# Patient Record
Sex: Male | Born: 1970 | Race: Black or African American | Hispanic: No | Marital: Single | Smoking: Current every day smoker
Health system: Southern US, Community
[De-identification: ages and names within clinical notes are randomized; demographics above are authoritative.]

## PROBLEM LIST (undated history)

## (undated) HISTORY — PX: FRACTURE SURGERY: SHX138

## (undated) HISTORY — PX: CARDIAC CATHETERIZATION: SHX172

---

## 1995-12-02 HISTORY — PX: ELBOW FRACTURE SURGERY: SHX616

## 2017-04-16 ENCOUNTER — Encounter (HOSPITAL_COMMUNITY)
Admission: EM | Disposition: A | Discharge status: Home / Self Care | Payer: Self-pay | Source: Home / Self Care | Attending: Emergency Medicine

## 2017-04-16 ENCOUNTER — Observation Stay (HOSPITAL_COMMUNITY): Payer: Self-pay

## 2017-04-16 ENCOUNTER — Observation Stay (HOSPITAL_COMMUNITY)
Admission: EM | Admit: 2017-04-16 | Discharge: 2017-04-17 | Disposition: A | Discharge status: Home / Self Care | Payer: Self-pay | Attending: Internal Medicine | Admitting: Internal Medicine

## 2017-04-16 ENCOUNTER — Encounter (HOSPITAL_COMMUNITY): Payer: Self-pay | Admitting: Emergency Medicine

## 2017-04-16 ENCOUNTER — Emergency Department (HOSPITAL_COMMUNITY): Payer: Self-pay

## 2017-04-16 DIAGNOSIS — F17200 Nicotine dependence, unspecified, uncomplicated: Secondary | ICD-10-CM | POA: Diagnosis present

## 2017-04-16 DIAGNOSIS — F17210 Nicotine dependence, cigarettes, uncomplicated: Secondary | ICD-10-CM | POA: Insufficient documentation

## 2017-04-16 DIAGNOSIS — Z0389 Encounter for observation for other suspected diseases and conditions ruled out: Secondary | ICD-10-CM

## 2017-04-16 DIAGNOSIS — F1410 Cocaine abuse, uncomplicated: Secondary | ICD-10-CM | POA: Insufficient documentation

## 2017-04-16 DIAGNOSIS — IMO0001 Reserved for inherently not codable concepts without codable children: Secondary | ICD-10-CM

## 2017-04-16 DIAGNOSIS — R0789 Other chest pain: Principal | ICD-10-CM | POA: Insufficient documentation

## 2017-04-16 DIAGNOSIS — I213 ST elevation (STEMI) myocardial infarction of unspecified site: Secondary | ICD-10-CM

## 2017-04-16 DIAGNOSIS — R7989 Other specified abnormal findings of blood chemistry: Secondary | ICD-10-CM | POA: Diagnosis present

## 2017-04-16 DIAGNOSIS — I200 Unstable angina: Secondary | ICD-10-CM

## 2017-04-16 DIAGNOSIS — R9431 Abnormal electrocardiogram [ECG] [EKG]: Secondary | ICD-10-CM

## 2017-04-16 DIAGNOSIS — R079 Chest pain, unspecified: Secondary | ICD-10-CM | POA: Diagnosis present

## 2017-04-16 HISTORY — PX: LEFT HEART CATH AND CORONARY ANGIOGRAPHY: CATH118249

## 2017-04-16 LAB — I-STAT CHEM 8, ED
BUN: 9 mg/dL (ref 6–20)
CALCIUM ION: 1.12 mmol/L — AB (ref 1.15–1.40)
CHLORIDE: 105 mmol/L (ref 101–111)
CREATININE: 0.7 mg/dL (ref 0.61–1.24)
GLUCOSE: 75 mg/dL (ref 65–99)
HCT: 42 % (ref 39.0–52.0)
HEMOGLOBIN: 14.3 g/dL (ref 13.0–17.0)
Potassium: 4.1 mmol/L (ref 3.5–5.1)
Sodium: 140 mmol/L (ref 135–145)
TCO2: 24 mmol/L (ref 0–100)

## 2017-04-16 LAB — CBC
HCT: 38.2 % — ABNORMAL LOW (ref 39.0–52.0)
Hemoglobin: 12.9 g/dL — ABNORMAL LOW (ref 13.0–17.0)
MCH: 27.3 pg (ref 26.0–34.0)
MCHC: 33.8 g/dL (ref 30.0–36.0)
MCV: 80.8 fL (ref 78.0–100.0)
PLATELETS: 260 10*3/uL (ref 150–400)
RBC: 4.73 MIL/uL (ref 4.22–5.81)
RDW: 15.2 % (ref 11.5–15.5)
WBC: 8.3 10*3/uL (ref 4.0–10.5)

## 2017-04-16 LAB — BASIC METABOLIC PANEL
Anion gap: 9 (ref 5–15)
BUN: 11 mg/dL (ref 6–20)
CALCIUM: 9 mg/dL (ref 8.9–10.3)
CO2: 23 mmol/L (ref 22–32)
CREATININE: 0.84 mg/dL (ref 0.61–1.24)
Chloride: 107 mmol/L (ref 101–111)
GFR calc non Af Amer: >60 mL/min (ref >60)
Glucose, Bld: 77 mg/dL (ref 65–99)
Potassium: 4.1 mmol/L (ref 3.5–5.1)
SODIUM: 139 mmol/L (ref 135–145)

## 2017-04-16 LAB — LIPID PANEL
Cholesterol: 153 mg/dL (ref 0–200)
HDL: 51 mg/dL (ref >40)
LDL CALC: 95 mg/dL (ref 0–99)
TRIGLYCERIDES: 36 mg/dL (ref <150)
Total CHOL/HDL Ratio: 3 RATIO
VLDL: 7 mg/dL (ref 0–40)

## 2017-04-16 LAB — RAPID URINE DRUG SCREEN, HOSP PERFORMED
AMPHETAMINES: NOT DETECTED
BENZODIAZEPINES: POSITIVE — AB
Barbiturates: NOT DETECTED
Cocaine: POSITIVE — AB
OPIATES: NOT DETECTED
TETRAHYDROCANNABINOL: NOT DETECTED

## 2017-04-16 LAB — TROPONIN I
Troponin I: <0.03 ng/mL (ref <0.03)
Troponin I: <0.03 ng/mL (ref <0.03)

## 2017-04-16 LAB — PROTIME-INR
INR: 0.9
PROTHROMBIN TIME: 12.2 s (ref 11.4–15.2)

## 2017-04-16 LAB — TSH: TSH: 0.331 u[IU]/mL — AB (ref 0.350–4.500)

## 2017-04-16 LAB — I-STAT TROPONIN, ED: TROPONIN I, POC: 0 ng/mL (ref 0.00–0.08)

## 2017-04-16 SURGERY — LEFT HEART CATH AND CORONARY ANGIOGRAPHY
Anesthesia: LOCAL

## 2017-04-16 MED ORDER — LIDOCAINE HCL (PF) 1 % IJ SOLN
INTRAMUSCULAR | Status: AC
  Filled 2017-04-16: qty 30

## 2017-04-16 MED ORDER — SODIUM CHLORIDE 0.9 % IV SOLN: INTRAVENOUS | Status: AC

## 2017-04-16 MED ORDER — MIDAZOLAM HCL 2 MG/2ML IJ SOLN
INTRAMUSCULAR | Status: DC | PRN
  Administered 2017-04-16: 1 mg via INTRAVENOUS

## 2017-04-16 MED ORDER — FENTANYL CITRATE (PF) 100 MCG/2ML IJ SOLN
INTRAMUSCULAR | Status: DC | PRN
  Administered 2017-04-16: 25 ug via INTRAVENOUS

## 2017-04-16 MED ORDER — VERAPAMIL HCL 2.5 MG/ML IV SOLN
INTRAVENOUS | Status: AC
  Filled 2017-04-16: qty 2

## 2017-04-16 MED ORDER — ASPIRIN 325 MG PO TABS
325.0000 mg | ORAL_TABLET | Freq: Once | ORAL | Status: AC
  Administered 2017-04-16: 325 mg via ORAL
  Filled 2017-04-16: qty 1

## 2017-04-16 MED ORDER — IOPAMIDOL (ISOVUE-370) INJECTION 76%
INTRAVENOUS | Status: DC | PRN
  Administered 2017-04-16: 80 mL via INTRAVENOUS

## 2017-04-16 MED ORDER — SODIUM CHLORIDE 0.9% FLUSH
3.0000 mL | Freq: Two times a day (BID) | INTRAVENOUS | Status: DC
  Administered 2017-04-16 – 2017-04-17 (×2): 3 mL via INTRAVENOUS

## 2017-04-16 MED ORDER — NITROGLYCERIN 0.4 MG SL SUBL
0.4000 mg | SUBLINGUAL_TABLET | SUBLINGUAL | Status: DC | PRN
  Administered 2017-04-16: 0.4 mg via SUBLINGUAL
  Filled 2017-04-16: qty 1

## 2017-04-16 MED ORDER — HEPARIN (PORCINE) IN NACL 2-0.9 UNIT/ML-% IJ SOLN
INTRAMUSCULAR | Status: AC
  Filled 2017-04-16: qty 1000

## 2017-04-16 MED ORDER — ENOXAPARIN SODIUM 40 MG/0.4ML ~~LOC~~ SOLN: 40.0000 mg | SUBCUTANEOUS | Status: DC

## 2017-04-16 MED ORDER — FENTANYL CITRATE (PF) 100 MCG/2ML IJ SOLN
INTRAMUSCULAR | Status: AC
  Filled 2017-04-16: qty 2

## 2017-04-16 MED ORDER — SODIUM CHLORIDE 0.9 % IV SOLN: 250.0000 mL | INTRAVENOUS | Status: DC | PRN

## 2017-04-16 MED ORDER — HEPARIN (PORCINE) IN NACL 2-0.9 UNIT/ML-% IJ SOLN
INTRAMUSCULAR | Status: AC | PRN
  Administered 2017-04-16: 1000 mL

## 2017-04-16 MED ORDER — IOPAMIDOL (ISOVUE-370) INJECTION 76%
INTRAVENOUS | Status: AC
  Filled 2017-04-16: qty 100

## 2017-04-16 MED ORDER — LIDOCAINE HCL (PF) 1 % IJ SOLN
INTRAMUSCULAR | Status: DC | PRN
  Administered 2017-04-16: 2 mL

## 2017-04-16 MED ORDER — ONDANSETRON HCL 4 MG/2ML IJ SOLN: 4.0000 mg | Freq: Four times a day (QID) | INTRAMUSCULAR | Status: DC | PRN

## 2017-04-16 MED ORDER — ACETAMINOPHEN 325 MG PO TABS: 650.0000 mg | ORAL_TABLET | ORAL | Status: DC | PRN

## 2017-04-16 MED ORDER — HEPARIN BOLUS VIA INFUSION
5000.0000 [IU] | Freq: Once | INTRAVENOUS | Status: AC
  Administered 2017-04-16: 5000 [IU] via INTRAVENOUS

## 2017-04-16 MED ORDER — ASPIRIN 81 MG PO CHEW
81.0000 mg | CHEWABLE_TABLET | Freq: Every day | ORAL | Status: DC
  Administered 2017-04-17: 81 mg via ORAL
  Filled 2017-04-16: qty 1

## 2017-04-16 MED ORDER — SODIUM CHLORIDE 0.9% FLUSH: 3.0000 mL | INTRAVENOUS | Status: DC | PRN

## 2017-04-16 MED ORDER — HEPARIN SODIUM (PORCINE) 1000 UNIT/ML IJ SOLN
INTRAMUSCULAR | Status: DC | PRN
  Administered 2017-04-16: 3000 [IU] via INTRAVENOUS

## 2017-04-16 MED ORDER — MIDAZOLAM HCL 2 MG/2ML IJ SOLN
INTRAMUSCULAR | Status: AC
  Filled 2017-04-16: qty 2

## 2017-04-16 MED ORDER — NITROGLYCERIN 1 MG/10 ML FOR IR/CATH LAB
INTRA_ARTERIAL | Status: AC
  Filled 2017-04-16: qty 10

## 2017-04-16 MED ORDER — VERAPAMIL HCL 2.5 MG/ML IV SOLN
INTRAVENOUS | Status: DC | PRN
  Administered 2017-04-16: 10 mL via INTRA_ARTERIAL

## 2017-04-16 SURGICAL SUPPLY — 12 items

## 2017-04-16 NOTE — ED Notes (Signed)
[facility] has arrived as pt was receiving initial treatments.  Report given to [employer].  Pt has been given a total of 1 SL nitroglycerin, Heparin 5000mg  IV, and ASA 325mg  PO

## 2017-04-16 NOTE — ED Triage Notes (Signed)
Pt arrives cardiology to bedside pt received total 3 Nitro 325mg  ASA and 5000 units of heparin

## 2017-04-16 NOTE — ED Triage Notes (Addendum)
Pt at work today and lifting heavy equipment, pt began experiencing sharp pain in L chest, pt rested and pain improved from 10/10 to 5/10. Pt stated pain worsened again with activity. States he did not take any OTC meds, did not take any ASA. Denies SOB, denies dizziness.

## 2017-04-16 NOTE — ED Notes (Signed)
Pt arrives sitting up answering questions appears in no distress Cardiologist at bedside

## 2017-04-16 NOTE — ED Provider Notes (Signed)
[facility] DEPT Provider Note   CSN: [id number] Arrival date & time: 04/16/17  5366     History   Chief Complaint No chief complaint on file.   HPI Derrick Jenkins is a 46 y.o. male with no known previous medical problems but has not seen doctors for years who presented with chest pain. States that he works at a [employer] and was at work at 6 am and usually [employer]. Today, around 7 am, had sudden onset of substernal chest pain that is 10/10 with no radiation. Denies jaw pain or arm pain. Denies abdominal pain or leg pain. No hx of DM, HTN, or CAD but hasn't seen doctors in years. Just moved here from [place] and has no doctors here. Denies any leg swelling or hx of blood clots. Still has 5/10 pain.   The history is provided by the patient.    History reviewed. No pertinent past medical history.  There are no active problems to display for this patient.   History reviewed. No pertinent surgical history.     Home Medications    Prior to Admission medications   Not on File    Family History No family history on file.  Social History Social History  Substance Use Topics  . Smoking status: Current Every Day Smoker    Types: Cigarettes  . Smokeless tobacco: Not on file  . Alcohol use Not on file     Allergies   Patient has no allergy information on record.   Review of Systems Review of Systems  Cardiovascular: Positive for chest pain.  All other systems reviewed and are negative.    Physical Exam Updated Vital Signs BP 125/79 (BP Location: Left Arm)   Pulse 77   Temp 98.8 F (37.1 C) (Oral)   Resp 19   SpO2 97%   Physical Exam  Constitutional: He is oriented to person, place, and time.  Uncomfortable   HENT:  Head: Normocephalic.  Eyes: EOM are normal. Pupils are equal, round, and reactive to light.  Neck: Normal range of motion. Neck supple.  Cardiovascular: Normal rate, regular rhythm and normal heart sounds.   Pulmonary/Chest: Effort  normal and breath sounds normal. No respiratory distress. He has no wheezes.  Abdominal: Soft. Bowel sounds are normal. He exhibits no distension. There is no tenderness.  Musculoskeletal: Normal range of motion. He exhibits no edema.  No pedal edema   Neurological: He is alert and oriented to person, place, and time. No cranial nerve deficit. Coordination normal.  Skin: Skin is warm.  Psychiatric: He has a normal mood and affect.  Nursing note and vitals reviewed.    ED Treatments / Results  Labs (all labs ordered are listed, but only abnormal results are displayed) Labs Reviewed  I-STAT CHEM 8, ED - Abnormal; Notable for the following:       Result Value   Calcium, Ion 1.12 (*)    All other components within normal limits  BASIC METABOLIC PANEL  CBC  PROTIME-INR  I-STAT TROPOININ, ED    EKG  EKG Interpretation  Date/Time:  Thursday Apr 16 2017 08:46:52 EDT Ventricular Rate:  64 PR Interval:    QRS Duration: 79 QT Interval:  379 QTC Calculation: 391 R Axis:   70 Text Interpretation:  Sinus rhythm Left ventricular hypertrophy ST elevation suggests acute pericarditis possible lateral STEMI  Confirmed by Adair Lemar  MD, Oliver Heitzenrater ([id number]) on 04/16/2017 8:51:55 AM       Radiology No results found.  Procedures Procedures (  including critical care time)  CRITICAL CARE Performed by: Wandra Arthurs   Total critical care time:30  minutes  Critical care time was exclusive of separately billable procedures and treating other patients.  Critical care was necessary to treat or prevent imminent or life-threatening deterioration.  Critical care was time spent personally by me on the following activities: development of treatment plan with patient and/or surrogate as well as nursing, discussions with consultants, evaluation of patient's response to treatment, examination of patient, obtaining history from patient or surrogate, ordering and performing treatments and interventions, ordering and  review of laboratory studies, ordering and review of radiographic studies, pulse oximetry and re-evaluation of patient's condition.   Medications Ordered in ED Medications  nitroGLYCERIN (NITROSTAT) SL tablet 0.4 mg (0.4 mg Sublingual Given 04/16/17 0850)  aspirin tablet 325 mg (325 mg Oral Given 04/16/17 0850)     Initial Impression / Assessment and Plan / ED Course  I have reviewed the triage vital signs and the nursing notes.  Pertinent labs & imaging results that were available during my care of the patient were reviewed by me and considered in my medical decision making (see chart for details).     Derrick Jenkins is a 46 y.o. male here with chest pain. Substernal chest pain still 5/10. Never had stress test and doesn't see a doctor. Initial EKG showed possible J point vs inferior and lateral STEMI. Given that he has good story for ACS, I am concerned for STEMI instead of J point elevation. He is given ASA, heparin, nitro. Repeat EKG showed persistent inferior and lateral STEMI. Code STEMI activated at 8:50 am. I called Dr. Martinique in the cath lab. Will transfer emergently to [facility] for STEMI. Dr. Jeneen Rinks in the ED aware of patient.   Final Clinical Impressions(s) / ED Diagnoses   Final diagnoses:  ST elevation myocardial infarction (STEMI), unspecified artery [facility])    New Prescriptions New Prescriptions   No medications on file     Drenda Freeze, MD 04/16/17 [phone]

## 2017-04-16 NOTE — ED Provider Notes (Signed)
Pt seen on arrival.  Cartilage is present. Repeat EKG obtained. Patient had been given nitroglycerin in route. Pain is improved but not completely resolved. Has had aspirin. Repeat EKG continues to show J-point elevation versus ST elevation and marked voltage laterally without reciprocal changes. Plan is directly to the Cath Lab as patient has ongoing pain. Hemolytically stable and well oxygenated in the emergency room.   Tanna Furry, MD 04/16/17 [phone]

## 2017-04-16 NOTE — ED Notes (Signed)
Code sepsis called by Dr Darl Householder verbally and was not put in computer.But was called within time limit.

## 2017-04-16 NOTE — ED Notes (Signed)
Cardiologist at bedside consent received for cardiac catheterization pt remains awake and alert and in no distress

## 2017-04-16 NOTE — H&P (Signed)
Cardiology History & Physical    Patient ID: Derrick Jenkins MRN: [id number], DOB: [birth year] Date of Encounter: 04/16/2017, 9:42 AM Primary Physician: Patient, No Pcp Per Primary Cardiologist: None  Chief Complaint: Chest pain Reason for Admission: STEMI Requesting MD: Derrick Goltz, MD  HPI: [name] is a [age]-year-old male with no significant past medical history, who developed acute onset of chest pain this morning. He was at work lifting heavy objects when he had sudden sharp substernal chest pain. Pain was worsened by movement, the maximal intensity of 10/10. He denies accompanying symptoms like shortness of breath, diaphoresis, and nausea. The pain did not radiate to the back. He has never experiences before. He presented to the [facility] emergency department, where her EKG was concerning for lateral ST segment elevation in the setting of LVH. He received sublingual nitroglycerin 3 with improvement but incomplete resolution of his chest pain. He continues to have mild chest pressure.  Of note, Derrick Jenkins used cocaine for the first time 3 days ago. He smokes half a pack a day and also drinks a 12 pack of beer on weekends.  History reviewed. No pertinent past medical history.   Surgical History: History reviewed. No pertinent surgical history.   Home Meds: Prior to Admission medications   Not on File  Patient does not take any medications currently.  Allergies: No Known Allergies  Social History   Social History  . Marital status: Single    Spouse name: N/A  . Number of children: N/A  . Years of education: N/A   Occupational History  . Not on file.   Social History Main Topics  . Smoking status: Current Every Day Smoker    Packs/day: 0.50    Types: Cigarettes  . Smokeless tobacco: Never Used  . Alcohol use 7.2 oz/week    12 Cans of beer per week  . Drug use: Yes    Types: Cocaine     Comment: Used for the first time 3 days ago  . Sexual activity: Yes   Partners: Male   Other Topics Concern  . Not on file   Social History Narrative  . No narrative on file     Family History  Problem Relation Age of Onset  . Lupus Mother   . Kidney failure Mother     Review of Systems: A 12-system review of systems was performed and is negative except as noted in the HPI.  Labs:   Lab Results  Component Value Date   WBC 8.3 04/16/2017   HGB 14.3 04/16/2017   HCT 42.0 04/16/2017   MCV 80.8 04/16/2017   PLT 260 04/16/2017     Recent Labs Lab 04/16/17 0847 04/16/17 0855  NA 139 140  K 4.1 4.1  CL 107 105  CO2 23  --   BUN 11 9  CREATININE 0.84 0.70  CALCIUM 9.0  --   GLUCOSE 77 75   No results for input(s): CKTOTAL, CKMB, TROPONINI in the last 72 hours. No results found for: CHOL, HDL, LDLCALC, TRIG No results found for: DDIMER  Radiology/Studies:  No results found. Wt Readings from Last 3 Encounters:  No data found for Wt    EKG: Sinus rhythm with LVH and ST elevation most pronounced in the lateral precordial leads.  Physical Exam: Blood pressure 125/71, pulse 65, temperature 97.7 F (36.5 C), temperature source Oral, resp. rate 18, SpO2 100 %. There is no height or weight on file to calculate BMI. General: Well developed,  well nourished, in no acute distress. Head: Normocephalic, atraumatic, sclera non-icteric, no xanthomas, nares are without discharge.  Neck: Negative for carotid bruits. JVD not elevated. Lungs: Clear bilaterally to auscultation without wheezes, rales, or rhonchi. Breathing is unlabored. Heart: RRR with S1 S2. No murmurs, rubs, or gallops appreciated. Abdomen: Soft, non-tender, non-distended with normoactive bowel sounds. No hepatomegaly. No rebound/guarding. No obvious abdominal masses. Msk:  Strength and tone appear normal for age. Extremities: No clubbing or cyanosis. No edema.  Distal pedal pulses are 2+ and equal bilaterally. Neuro: Alert and oriented X 3. No focal deficit. No facial asymmetry.  Moves all extremities spontaneously. Psych:  Responds to questions appropriately with a normal affect.    Assessment and Plan  46 year old man with no significant past medical history presenting with acute onset of chest pain or lateral ST segment elevation. Though his EKG is suspicious for possible LVH with abnormal repolarization, in the setting of ongoing chest pain, I feel it is important to exclude acute myocardial infarction. We have therefore agreed to proceed with emergent left heart catheterization and possible PCI. I have reviewed the risks, indications, and alternatives to cardiac catheterization, possible angioplasty, and stenting with the patient. Risks include but are not limited to bleeding, infection, vascular injury, stroke, myocardial infection, arrhythmia, kidney injury, radiation-related injury in the case of prolonged fluoroscopy use, emergency cardiac surgery, and death. The patient understands the risks of serious complication is 1-2 in 9532 with diagnostic cardiac cath and 1-2% or less with angioplasty/stenting. Further recommendations to be made based on results of catheterization.  Derrick Friends Daneille Desilva MD 04/16/2017, 9:42 AM Pager: [phone]

## 2017-04-17 ENCOUNTER — Observation Stay (HOSPITAL_BASED_OUTPATIENT_CLINIC_OR_DEPARTMENT_OTHER): Payer: Self-pay

## 2017-04-17 DIAGNOSIS — R7989 Other specified abnormal findings of blood chemistry: Secondary | ICD-10-CM | POA: Diagnosis present

## 2017-04-17 DIAGNOSIS — IMO0001 Reserved for inherently not codable concepts without codable children: Secondary | ICD-10-CM

## 2017-04-17 DIAGNOSIS — R079 Chest pain, unspecified: Secondary | ICD-10-CM

## 2017-04-17 DIAGNOSIS — R072 Precordial pain: Secondary | ICD-10-CM

## 2017-04-17 DIAGNOSIS — F1410 Cocaine abuse, uncomplicated: Secondary | ICD-10-CM

## 2017-04-17 DIAGNOSIS — F17200 Nicotine dependence, unspecified, uncomplicated: Secondary | ICD-10-CM

## 2017-04-17 DIAGNOSIS — Z0389 Encounter for observation for other suspected diseases and conditions ruled out: Secondary | ICD-10-CM

## 2017-04-17 LAB — BASIC METABOLIC PANEL
Anion gap: 8 (ref 5–15)
BUN: 12 mg/dL (ref 6–20)
CO2: 24 mmol/L (ref 22–32)
CREATININE: 0.85 mg/dL (ref 0.61–1.24)
Calcium: 8.8 mg/dL — ABNORMAL LOW (ref 8.9–10.3)
Chloride: 105 mmol/L (ref 101–111)
Glucose, Bld: 104 mg/dL — ABNORMAL HIGH (ref 65–99)
POTASSIUM: 3.9 mmol/L (ref 3.5–5.1)
SODIUM: 137 mmol/L (ref 135–145)

## 2017-04-17 LAB — CBC
HCT: 37.5 % — ABNORMAL LOW (ref 39.0–52.0)
HEMOGLOBIN: 12.4 g/dL — AB (ref 13.0–17.0)
MCH: 27 pg (ref 26.0–34.0)
MCHC: 33.1 g/dL (ref 30.0–36.0)
MCV: 81.5 fL (ref 78.0–100.0)
PLATELETS: 235 10*3/uL (ref 150–400)
RBC: 4.6 MIL/uL (ref 4.22–5.81)
RDW: 15.6 % — ABNORMAL HIGH (ref 11.5–15.5)
WBC: 7.3 10*3/uL (ref 4.0–10.5)

## 2017-04-17 LAB — HEMOGLOBIN A1C
HEMOGLOBIN A1C: 5.8 % — AB (ref 4.8–5.6)
MEAN PLASMA GLUCOSE: 120 mg/dL

## 2017-04-17 LAB — ECHOCARDIOGRAM COMPLETE

## 2017-04-17 MED ORDER — ACETAMINOPHEN 325 MG PO TABS: 650.0000 mg | ORAL_TABLET | ORAL | Status: DC | PRN

## 2017-04-17 MED ORDER — ASPIRIN 81 MG PO CHEW: 81.0000 mg | CHEWABLE_TABLET | Freq: Every day | ORAL | Status: DC

## 2017-04-17 MED ORDER — NITROGLYCERIN 0.4 MG SL SUBL: 0.4000 mg | SUBLINGUAL_TABLET | SUBLINGUAL | 2 refills | Status: DC | PRN

## 2017-04-17 NOTE — Progress Notes (Signed)
Progress Note  Patient Name: Derrick Jenkins Date of Encounter: 04/17/2017  Primary Cardiologist: Dr End  Subjective   No chest pain  Inpatient Medications    Scheduled Meds: . aspirin  81 mg Oral Daily  . enoxaparin (LOVENOX) injection  40 mg Subcutaneous Q24H  . sodium chloride flush  3 mL Intravenous Q12H   Continuous Infusions: . sodium chloride     PRN Meds: sodium chloride, acetaminophen, nitroGLYCERIN, ondansetron (ZOFRAN) IV, sodium chloride flush   Vital Signs    Vitals:   04/16/17 1315 04/16/17 1345 04/16/17 2114 04/17/17 0528  BP: (!) 94/50 (!) 105/50 122/63 127/78  Pulse: (!) 59 (!) 127 62 (!) 59  Resp:   18 18  Temp:   97.7 F (36.5 C) 98 F (36.7 C)  TempSrc:   Oral Oral  SpO2:   100% 99%    Intake/Output Summary (Last 24 hours) at 04/17/17 0902 Last data filed at 04/16/17 2116  Gross per 24 hour  Intake              480 ml  Output                0 ml  Net              480 ml   There were no vitals filed for this visit.  Telemetry    NSR - Personally Reviewed  ECG    NSR, SB LVH, NSST changes - Personally Reviewed  Physical Exam   GEN: No acute distress.   Neck: No JVD Cardiac: RRR, no murmurs, rubs, or gallops.  Respiratory: Clear to auscultation bilaterally. GI: Soft, nontender, non-distended  MS: Rt radial without hemaotoma Neuro:  Nonfocal  Psych: Normal affect   Labs    Chemistry  Recent Labs Lab 04/16/17 0847 04/16/17 0855 04/17/17 0336  NA 139 140 137  K 4.1 4.1 3.9  CL 107 105 105  CO2 23  --  24  GLUCOSE 77 75 104*  BUN 11 9 12   CREATININE 0.84 0.70 0.85  CALCIUM 9.0  --  8.8*  GFRNONAA >60  --  >60  GFRAA >60  --  >60  ANIONGAP 9  --  8     Hematology  Recent Labs Lab 04/16/17 0847 04/16/17 0855 04/17/17 0336  WBC 8.3  --  7.3  RBC 4.73  --  4.60  HGB 12.9* 14.3 12.4*  HCT 38.2* 42.0 37.5*  MCV 80.8  --  81.5  MCH 27.3  --  27.0  MCHC 33.8  --  33.1  RDW 15.2  --  15.6*  PLT 260  --  235      Cardiac Enzymes  Recent Labs Lab 04/16/17 1110 04/16/17 1548 04/16/17 2208  TROPONINI <0.03 <0.03 <0.03     Recent Labs Lab 04/16/17 0853  TROPIPOC 0.00     BNPNo results for input(s): BNP, PROBNP in the last 168 hours.   DDimer No results for input(s): DDIMER in the last 168 hours.   Radiology    Dg Chest Port 1 View  Result Date: 04/16/2017 CLINICAL DATA:  Status post cardiac catheterization. EXAM: PORTABLE CHEST 1 VIEW COMPARISON:  None. FINDINGS: The heart size and mediastinal contours are within normal limits. Both lungs are clear. The visualized skeletal structures are unremarkable. IMPRESSION: No active disease. Electronically Signed   By: Kerby Moors M.D.   On: 04/16/2017 15:51    Cardiac Studies   Cath 04/16/17- 1. Mild plaquing of the distal  LAD. Otherwise, no angiographically significant coronary artery disease. 2. Normal left ventricular filling pressure and contraction.   Patient Profile     46 y.o. male, just moved to the area, no PCP, admitted through the [facility] ED 04/16/17 with chest worrisome for [place] and with an abnormal EKG. He admitted to recent cocaine use and is a smoker. He was transferred to [facility] for cath. This revealed no obstructive CAD. His Troponin have been negative. Echo is pending.  Assessment & Plan   Chest pain with high risk of acute coronary syndrome Non obstructive CAD at cath, negative Troponin  Abnormal EKG  LVH and NSST changes  Cocaine abuse Drug screen +  Smoker 1/2 pk day  Low TSH level 0.331- no symptoms c/w hyperthyroidism  Plan: Ambulate, echo today, he can probably go home after his echo and f/u results and repeat TSH as an OP. Discussed smoking cessation and cocaine use.   Signed, Kerin Ransom, PA-C  04/17/2017, 9:02 AM    I have seen, examined and evaluated the patient this AM along with Mr. Rosalyn Gess, [place] on rounds.  After reviewing all the available data and chart, we discussed the patients laboratory, study &  physical findings as well as symptoms in detail. I agree with his findings, examination as well as impression recommendations as per our discussion.    Attending adjustments noted in italics.   He is doing well today. I suspect his EKG changes related to repolarization. He has no tachycardia ordered which I don't necessarily think needs to be done prior to discharge. If it is not done by mid afternoon because of scheduling issues, this can just be done as an outpatient. Otherwise I agree with smoking cessation and cocaine cessation counseling which I continued along with Mr. Tiajuana Amass discussion.  Otherwise blood pressures are stable. Could consider statin for outpatient if he would take it along with a baby aspirin. Otherwise I would think additional cardiac evaluation is warranted unless his echocardiogram is abnormal.  He could be discharged this afternoon without echocardiogram as noted - would simply order outpatient.   Glenetta Hew, M.D., M.S. Interventional Cardiologist   Pager # [phone] Phone # [phone] [address] [place], [place] [address]

## 2017-04-17 NOTE — Discharge Summary (Signed)
Discharge Summary    Patient ID: Derrick Jenkins,  MRN: [id number], DOB/AGE: 47-Jun-1972 47 y.o.  Admit date: 04/16/2017 Discharge date: 04/17/2017  Primary Care Provider: Patient, No Pcp Per Primary Cardiologist: Dr End  Discharge Diagnoses    Principal Problem:   Chest pain with high risk of acute coronary syndrome Active Problems:   Abnormal EKG   Cocaine abuse   Smoker   Low TSH level   Normal coronary arteries   Allergies No Known Allergies  Diagnostic Studies/Procedures    Cath 04/16/17 Echo 04/17/17 _____________   History of Present Illness     46 y/o male transferred from [facility] ED for cath  Hospital Course     46 year old male, recently moved to this area, with no significant past medical history, no PCP, who developed acute onset of chest pain while at work 04/16/17. He presented to the [facility] emergency department, where his EKG was concerning for lateral ST segment elevation in the setting of LVH. He received sublingual nitroglycerin 3 with improvement but incomplete resolution of his chest pain. He was transferred to [facility] for further evaluation. Of note, Mr. Bastin used cocaine for the first time 3 days prior to admission. He smokes half a pack a day and also drinks a 12 pack of beer on weekends.  Cath done 04/16/17 showed no obstructive CAD. LVF was normal and troponin negative x 3. He was ambulated 04/17/17 and did well. An echo was done before discharge and the results are pending.  _____________  Discharge Vitals Blood pressure 127/78, pulse (!) 59, temperature 98 F (36.7 C), temperature source Oral, resp. rate 18, SpO2 99 %.  There were no vitals filed for this visit.  Labs & Radiologic Studies    CBC  Recent Labs  04/16/17 0847 04/16/17 0855 04/17/17 0336  WBC 8.3  --  7.3  HGB 12.9* 14.3 12.4*  HCT 38.2* 42.0 37.5*  MCV 80.8  --  81.5  PLT 260  --  650   Basic Metabolic Panel  Recent Labs  04/16/17 0847 04/16/17 0855  04/17/17 0336  NA 139 140 137  K 4.1 4.1 3.9  CL 107 105 105  CO2 23  --  24  GLUCOSE 77 75 104*  BUN 11 9 12   CREATININE 0.84 0.70 0.85  CALCIUM 9.0  --  8.8*   Liver Function Tests No results for input(s): AST, ALT, ALKPHOS, BILITOT, PROT, ALBUMIN in the last 72 hours. No results for input(s): LIPASE, AMYLASE in the last 72 hours. Cardiac Enzymes  Recent Labs  04/16/17 1110 04/16/17 1548 04/16/17 2208  TROPONINI <0.03 <0.03 <0.03    Recent Labs  04/16/17 1110  HGBA1C 5.8*   Fasting Lipid Panel  Recent Labs  04/16/17 1110  CHOL 153  HDL 51  LDLCALC 95  TRIG 36  CHOLHDL 3.0   Thyroid Function Tests  Recent Labs  04/16/17 1110  TSH 0.331*   _____________  Dg Chest Port 1 View  Result Date: 04/16/2017 CLINICAL DATA:  Status post cardiac catheterization. EXAM: PORTABLE CHEST 1 VIEW COMPARISON:  None. FINDINGS: The heart size and mediastinal contours are within normal limits. Both lungs are clear. The visualized skeletal structures are unremarkable. IMPRESSION: No active disease. Electronically Signed   By: Kerby Moors M.D.   On: 04/16/2017 15:51   Disposition   Pt is being discharged home today in good condition.  Follow-up Plans & Appointments    Follow-up Information    End, Harrell Gave, MD  Follow up.   Specialty:  Cardiology Why:  Office will contact you Contact information: [place] [address] [phone]            Discharge Medications   Current Discharge Medication List    START taking these medications   Details  acetaminophen (TYLENOL) 325 MG tablet Take 2 tablets (650 mg total) by mouth every 4 (four) hours as needed for headache or mild pain.    aspirin 81 MG chewable tablet Chew 1 tablet (81 mg total) by mouth daily.    nitroGLYCERIN (NITROSTAT) 0.4 MG SL tablet Place 1 tablet (0.4 mg total) under the tongue every 5 (five) minutes as needed for chest pain (CP or SOB). Qty: 25 tablet, Refills:  2            Outstanding Labs/Studies   Echo pending He will need a repeat TSH at follow up  Duration of Discharge Encounter   Greater than 30 minutes including physician time.  Angelena Form PA 04/17/2017, 12:51 PM

## 2017-04-17 NOTE — Discharge Instructions (Signed)
Health Risks of Smoking Smoking cigarettes is very bad for your health. Tobacco smoke has over 200 known poisons in it. It contains the poisonous gases nitrogen oxide and carbon monoxide. There are over 60 chemicals in tobacco smoke that cause cancer. Smoking is difficult to quit because a chemical in tobacco, called nicotine, causes addiction or dependence. When you smoke and inhale, nicotine is absorbed rapidly into the bloodstream through your lungs. Both inhaled and non-inhaled nicotine may be addictive. What are the risks of cigarette smoke? Cigarette smokers have an increased risk of many serious medical problems, including:  Lung cancer.  Lung disease, such as pneumonia, bronchitis, and emphysema.  Chest pain (angina) and heart attack because the heart is not getting enough oxygen.  Heart disease and peripheral blood vessel disease.  High blood pressure (hypertension).  Stroke.  Oral cancer, including cancer of the lip, mouth, or voice box.  Bladder cancer.  Pancreatic cancer.  Cervical cancer.  Pregnancy complications, including premature birth.  Stillbirths and smaller newborn babies, birth defects, and genetic damage to sperm.  Early menopause.  Lower estrogen level for women.  Infertility.  Facial wrinkles.  Blindness.  Increased risk of broken bones (fractures).  Senile dementia.  Stomach ulcers and internal bleeding.  Delayed wound healing and increased risk of complications during surgery.  Even smoking lightly shortens your life expectancy by several years. Because of secondhand smoke exposure, children of smokers have an increased risk of the following:  Sudden infant death syndrome (SIDS).  Respiratory infections.  Lung cancer.  Heart disease.  Ear infections. What are the benefits of quitting? There are many health benefits of quitting smoking. Here are some of them:  Within days of quitting smoking, your risk of having a heart attack  decreases, your blood flow improves, and your lung capacity improves. Blood pressure, pulse rate, and breathing patterns start returning to normal soon after quitting.  Within months, your lungs may clear up completely.  Quitting for 10 years reduces your risk of developing lung cancer and heart disease to almost that of a nonsmoker.  People who quit may see an improvement in their overall quality of life. How do I quit smoking? Smoking is an addiction with both physical and psychological effects, and longtime habits can be hard to change. Your health care provider can recommend:  Programs and community resources, which may include group support, education, or talk therapy.  Prescription medicines to help reduce cravings.  Nicotine replacement products, such as patches, gum, and nasal sprays. Use these products only as directed. Do not replace cigarette smoking with electronic cigarettes, which are commonly called e-cigarettes. The safety of e-cigarettes is not known, and some may contain harmful chemicals.  A combination of two or more of these methods. Where to find more information:  American Lung Association: www.lung.org  American Cancer Society: www.cancer.org Summary  Smoking cigarettes is very bad for your health. Cigarette smokers have an increased risk of many serious medical problems, including several cancers, heart disease, and stroke.  Smoking is an addiction with both physical and psychological effects, and longtime habits can be hard to change.  By stopping right away, you can greatly reduce the risk of medical problems for you and your family.  To help you quit smoking, your health care provider can recommend programs, community resources, prescription medicines, and nicotine replacement products such as patches, gum, and nasal sprays. This information is not intended to replace advice given to you by your health care provider. Make sure  you discuss any questions you  have with your health care provider. Document Released: 12/25/2004 Document Revised: 11/21/2016 Document Reviewed: 11/21/2016 Elsevier Interactive Patient Education  2017 [place]. Stimulant Use Disorder-Cocaine Cocaine belongs to a group of powerful drugs known as stimulants. Common street names for cocaine include coke, crack, blow, snow, C, powder, and nose candy. Cocaine has some medical uses, but it is often misused because of the effects that it produces. These effects include:  A feeling of extreme pleasure (euphoria).  Alertness.  A high energy level. Stimulant use disorder is when your stimulant use disrupts your daily life. It may disrupt your relationships and how you do your job. Stimulant use disorder can be dangerous. Cocaine increases your blood pressure and heart rate. Using it can lead to a heart attack or stroke. Cocaine can also make your heart rate irregular and cause seizures. These problems can lead to death. What are the causes? This condition is caused by misusing cocaine. Many people start using cocaine because it makes them feel good. Over time, they get addicted to it. When they try to stop using it, they feel sick. What increases the risk? This condition is more likely to develop in:  People who misuse other drugs.  People with a family history of misusing drugs. What are the signs or symptoms? Symptoms of this condition include:  Using greater amounts of cocaine than you want to, or using cocaine for longer than you want to.  Trying several times to use less cocaine or to control your cocaine use.  Craving cocaine.  Spending a lot of time getting cocaine, using it, or recovering from its effects.  Having problems at work, at school, at home, or with relationships because of cocaine use.  Giving up or cutting down on important life activities because of cocaine use.  Using cocaine when it is dangerous, such as when driving a car.  Continuing to  use cocaine even though it is causing or has led to a physical problem, such as:  Malnutrition.  Nosebleeds.  Chest pain.  High blood pressure.  A hole between the part of your nose that separates your nostrils (perforated nasal septum).  Lung and kidney damage.  Continuing to use cocaine even though it is causing a mental problem, such as:  Schizophrenia-like symptoms.  Depression.  Bipolar mood swings.  Anxiety.  Sleep problems.  Needing more and more cocaine to get the same effect that you want (building up a tolerance).  Having symptoms of withdrawal when you stop using cocaine. Symptoms of withdrawal include:  Depression.  Irritability.  Low energy.  Restlessness.  Bad dreams.  Too little or too much sleep.  Increased appetite. How is this diagnosed? This condition is diagnosed with an assessment. During the assessment, your health care provider will ask about your cocaine use and about how it affects your life. Your health care provider may also:  Perform a physical exam or do lab tests to see if you have physical problems resulting from cocaine use.  Screen for drug use.  Refer you to a mental health professional for evaluation. How is this treated? Treatment for this condition is usually provided by mental health professionals with training in substance use disorders. Treatment may involve:  Counseling. This treatment is also called talk therapy. It is provided by substance use treatment counselors. A counselor can address the reasons you use cocaine and suggest ways to keep you from using it again. The goals of talk therapy are  to:  Find healthy activities to replace using cocaine.  Identify and avoid what triggers your cocaine use.  Help you learn how to handle cravings.  Support groups. Support groups are run by people who have quit using stimulants. They provide emotional support, advice, and guidance.  Medicines. Follow these instructions  at home:  Take over-the-counter and prescription medicines only as told by your health care provider.  Check with your health care provider before starting any new medicines.  Do not use any products that contain nicotine or tobacco, such as cigarettes and e-cigarettes. If you need help quitting, ask your health care provider.  Keep all follow-up visits as told by your health care provider. This is important. Where to find more information:  [employer] on Drug Abuse: motorcyclefax.com  Substance Abuse and Mental Health Services Administration: ktimeonline.com Contact a health care provider if:  You are not able to take your medicines as told.  You use cocaine again.  Your symptoms get worse. Get help right away if:  You have serious thoughts about hurting yourself or others.  You have a seizure.  You have chest pain.  You have sudden weakness.  You lose some of your vision.  You lose some of your speech. If you ever feel like you may hurt yourself or others, or have thoughts about taking your own life, get help right away. You can go to your nearest emergency department or call:  Your local emergency services (911 in the [place]  A suicide crisis helpline, such as the [facility] at 475-806-0767. This is open 24 hours a day. This information is not intended to replace advice given to you by your health care provider. Make sure you discuss any questions you have with your health care provider. Document Released: 11/14/2000 Document Revised: 08/29/2016 Document Reviewed: 08/29/2016 Elsevier Interactive Patient Education  2017 [place] Refer to this sheet in the next few weeks. These instructions provide you with information about caring for yourself after your procedure. Your health care provider may also give you more specific instructions. Your treatment has been planned according to current medical practices, but  problems sometimes occur. Call your health care provider if you have any problems or questions after your procedure. What can I expect after the procedure? After your procedure, it is typical to have the following:  Bruising at the radial site that usually fades within 1-2 weeks.  Blood collecting in the tissue (hematoma) that may be painful to the touch. It should usually decrease in size and tenderness within 1-2 weeks. Follow these instructions at home:  Take medicines only as directed by your health care provider.  You may shower 24-48 hours after the procedure or as directed by your health care provider. Remove the bandage (dressing) and gently wash the site with plain soap and water. Pat the area dry with a clean towel. Do not rub the site, because this may cause bleeding.  Do not take baths, swim, or use a hot tub until your health care provider approves.  Check your insertion site every day for redness, swelling, or drainage.  Do not apply powder or lotion to the site.  Do not flex or bend the affected arm for 24 hours or as directed by your health care provider.  Do not push or pull heavy objects with the affected arm for 24 hours or as directed by your health care provider.  Do not lift over 10 lb (4.5 kg) for  5 days after your procedure or as directed by your health care provider.  Ask your health care provider when it is okay to:  Return to work or school.  Resume usual physical activities or sports.  Resume sexual activity.  Do not drive home if you are discharged the same day as the procedure. Have someone else drive you.  You may drive 24 hours after the procedure unless otherwise instructed by your health care provider.  Do not operate machinery or power tools for 24 hours after the procedure.  If your procedure was done as an outpatient procedure, which means that you went home the same day as your procedure, a responsible adult should be with you for the first  24 hours after you arrive home.  Keep all follow-up visits as directed by your health care provider. This is important. Contact a health care provider if:  You have a fever.  You have chills.  You have increased bleeding from the radial site. Hold pressure on the site. Get help right away if:  You have unusual pain at the radial site.  You have redness, warmth, or swelling at the radial site.  You have drainage (other than a small amount of blood on the dressing) from the radial site.  The radial site is bleeding, and the bleeding does not stop after 30 minutes of holding steady pressure on the site.  Your arm or hand becomes pale, cool, tingly, or numb. This information is not intended to replace advice given to you by your health care provider. Make sure you discuss any questions you have with your health care provider. Document Released: 12/20/2010 Document Revised: 04/24/2016 Document Reviewed: 06/05/2014 Elsevier Interactive Patient Education  2017 [employer].

## 2017-04-17 NOTE — Progress Notes (Signed)
  Echocardiogram 2D Echocardiogram has been performed.  Derrick Jenkins 04/17/2017, 2:40 PM

## 2017-04-20 ENCOUNTER — Encounter: Payer: Self-pay | Admitting: Physician Assistant

## 2017-04-20 ENCOUNTER — Telehealth: Payer: Self-pay | Admitting: *Deleted

## 2017-04-20 NOTE — Telephone Encounter (Signed)
Patient came in as a walk in. He stated that he needed a return to work note that stated "no further restrictions." The letter has been done for him.

## 2017-04-23 ENCOUNTER — Ambulatory Visit: Payer: Self-pay | Admitting: Internal Medicine

## 2017-05-01 ENCOUNTER — Encounter: Payer: Self-pay | Admitting: Internal Medicine

## 2017-10-30 ENCOUNTER — Ambulatory Visit (INDEPENDENT_AMBULATORY_CARE_PROVIDER_SITE_OTHER): Payer: Self-pay | Admitting: Pharmacist Clinician (PhC)/ Clinical Pharmacy Specialist

## 2017-10-30 DIAGNOSIS — Z7252 High risk homosexual behavior: Secondary | ICD-10-CM

## 2017-10-30 NOTE — Progress Notes (Signed)
HPI: Derrick Jenkins is a 46 y.o. male who is here for his uninsured PrEP visit with pharmacy.   Allergies: No Known Allergies  Vitals:    Past Medical History: No past medical history on file.  Social History: Social History   Socioeconomic History  . Marital status: Single    Spouse name: Not on file  . Number of children: Not on file  . Years of education: Not on file  . Highest education level: Not on file  Social Needs  . Financial resource strain: Not on file  . Food insecurity - worry: Not on file  . Food insecurity - inability: Not on file  . Transportation needs - medical: Not on file  . Transportation needs - non-medical: Not on file  Occupational History  . Not on file  Tobacco Use  . Smoking status: Current Every Day Smoker    Packs/day: 0.50    Years: 28.00    Pack years: 14.00    Types: Cigarettes  . Smokeless tobacco: Never Used  Substance and Sexual Activity  . Alcohol use: Yes    Alcohol/week: 7.2 oz    Types: 12 Cans of beer per week    Comment: 04/16/2017 "I drink on Friday nights only"  . Drug use: Yes    Types: Cocaine    Comment: 04/16/2017 "tried it 04/13/2017 for the 1st time; never again"  . Sexual activity: Not Currently    Partners: Male  Other Topics Concern  . Not on file  Social History Narrative  . Not on file    Current Regimen: None  Labs: No results found for: HIV1RNAQUANT, HIV1RNAVL, CD4TABS, HEPBSAB, HEPBSAG, HCVAB  CrCl: CrCl cannot be calculated (Patient's most recent lab result is older than the maximum 21 days allowed.).  Lipids:    Component Value Date/Time   CHOL 153 04/16/2017 1110   TRIG 36 04/16/2017 1110   HDL 51 04/16/2017 1110   CHOLHDL 3.0 04/16/2017 1110   VLDL 7 04/16/2017 1110   LDLCALC 95 04/16/2017 1110    Assessment: Derrick Jenkins is here with his HIV positive partner for PrEP. I saw both of them a few weeks ago for his HIV positive partner visit with Dr. Johnnye Sima. We started the PrEP discussion for  uninsured patients. They have not had any intercourse because he is not on PrEP. His partner is currently suppressed on his ART. We will do all baseline PrEP labs today. He brought all of his paperwork for submission once his HIV test is back. Made him an appt to come back in Jan for the 1 month visit.   Recommendations:  All baseline HIV PrEP labs Submit paper work if neg F/u in 1 month  Onnie Boer, PharmD, BCPS, AAHIVP, [facility] 10/30/2017, 12:13 PM

## 2017-11-02 LAB — URINE CYTOLOGY ANCILLARY ONLY
Chlamydia: NEGATIVE
Neisseria Gonorrhea: NEGATIVE

## 2017-11-02 LAB — RPR: RPR Ser Ql: NONREACTIVE

## 2017-11-02 LAB — HEPATITIS B SURFACE ANTIGEN: Hepatitis B Surface Ag: NONREACTIVE

## 2017-11-02 LAB — BASIC METABOLIC PANEL
BUN: 19 mg/dL (ref 7–25)
CHLORIDE: 104 mmol/L (ref 98–110)
CO2: 25 mmol/L (ref 20–32)
CREATININE: 0.84 mg/dL (ref 0.60–1.35)
Calcium: 9.1 mg/dL (ref 8.6–10.3)
Glucose, Bld: 84 mg/dL (ref 65–99)
POTASSIUM: 4.2 mmol/L (ref 3.5–5.3)
SODIUM: 138 mmol/L (ref 135–146)

## 2017-11-02 LAB — CYTOLOGY, (ORAL, ANAL, URETHRAL) ANCILLARY ONLY
CHLAMYDIA, DNA PROBE: NEGATIVE
CHLAMYDIA, DNA PROBE: NEGATIVE
NEISSERIA GONORRHEA: NEGATIVE
Neisseria Gonorrhea: NEGATIVE

## 2017-11-02 LAB — HEPATITIS B SURFACE ANTIBODY,QUALITATIVE: Hep B S Ab: REACTIVE — AB

## 2017-11-02 LAB — HIV ANTIBODY (ROUTINE TESTING W REFLEX): HIV 1&2 Ab, 4th Generation: NONREACTIVE

## 2017-11-04 ENCOUNTER — Other Ambulatory Visit: Payer: Self-pay | Admitting: Pharmacist Clinician (PhC)/ Clinical Pharmacy Specialist

## 2017-11-04 MED ORDER — EMTRICITABINE-TENOFOVIR DF 200-300 MG PO TABS: 1.0000 | ORAL_TABLET | Freq: Every day | ORAL | 0 refills | Status: DC

## 2017-11-05 ENCOUNTER — Telehealth: Payer: Self-pay | Admitting: Pharmacist Clinician (PhC)/ Clinical Pharmacy Specialist

## 2017-11-05 NOTE — Telephone Encounter (Signed)
His [place] is ready to be shipped or picked at [facility]. Left a VM

## 2017-11-06 ENCOUNTER — Telehealth: Payer: Self-pay | Admitting: Pharmacist Clinician (PhC)/ Clinical Pharmacy Specialist

## 2017-11-06 MED FILL — TRUVADA 200-300 MG TABS: 200-300 | 30 days supply | Qty: 30 | Fill #0

## 2017-11-06 NOTE — Telephone Encounter (Signed)
Left another VM to get his PrEP.

## 2017-11-06 NOTE — Telephone Encounter (Signed)
He will pick up his [place] today

## 2017-12-02 ENCOUNTER — Ambulatory Visit: Payer: Self-pay

## 2017-12-15 ENCOUNTER — Telehealth: Payer: Self-pay | Admitting: Pharmacist Clinician (PhC)/ Clinical Pharmacy Specialist

## 2017-12-15 NOTE — Telephone Encounter (Signed)
His phone number has constant busy signal. Left a VM for his friend Everardo to see if there is a new number.

## 2017-12-24 ENCOUNTER — Encounter (HOSPITAL_COMMUNITY): Payer: Self-pay | Admitting: *Deleted

## 2017-12-24 ENCOUNTER — Emergency Department (HOSPITAL_COMMUNITY)
Admission: EM | Admit: 2017-12-24 | Discharge: 2017-12-24 | Disposition: A | Discharge status: Home / Self Care | Payer: Self-pay | Attending: Emergency Medicine | Admitting: Emergency Medicine

## 2017-12-24 ENCOUNTER — Other Ambulatory Visit: Payer: Self-pay

## 2017-12-24 DIAGNOSIS — F17210 Nicotine dependence, cigarettes, uncomplicated: Secondary | ICD-10-CM | POA: Insufficient documentation

## 2017-12-24 DIAGNOSIS — M25511 Pain in right shoulder: Secondary | ICD-10-CM | POA: Insufficient documentation

## 2017-12-24 MED ORDER — IBUPROFEN 800 MG PO TABS: 800.0000 mg | ORAL_TABLET | Freq: Three times a day (TID) | ORAL | 0 refills | Status: DC

## 2017-12-24 MED ORDER — KETOROLAC TROMETHAMINE 60 MG/2ML IM SOLN
60.0000 mg | Freq: Once | INTRAMUSCULAR | Status: AC
  Administered 2017-12-24: 60 mg via INTRAMUSCULAR
  Filled 2017-12-24: qty 2

## 2017-12-24 NOTE — ED Provider Notes (Signed)
Emergency Department Provider Note   I have reviewed the triage vital signs and the nursing notes.   HISTORY  Chief Complaint Arm Pain   HPI Derrick Jenkins is a 47 y.o. male who works at [employer] [place] the presents to the emergency department today with anterior right shoulder pain that seems to sometimes radiate to his right proximal humerus.  Patient states that this started around 3:00 yesterday got better with Biofreeze but then got worse again with movement.  Patient states it seems to hurt whenever he lifts his arm up straight and when you push on his anterior shoulder.  Never anything this before.  No trauma.  No known instigating factor.  States he has been so in the same amount of boxes in the same weight of boxes. No other associated or modifying symptoms.    History reviewed. No pertinent past medical history.  Patient Active Problem List   Diagnosis Date Noted  . Cocaine abuse ([place]) 04/17/2017  . Smoker 04/17/2017  . Low TSH level 04/17/2017  . Normal coronary arteries 04/17/2017  . Chest pain with high risk of acute coronary syndrome 04/16/2017  . Abnormal EKG 04/16/2017  . Unstable angina ([place]) 04/16/2017    Past Surgical History:  Procedure Laterality Date  . CARDIAC CATHETERIZATION    . ELBOW FRACTURE SURGERY Left 1997   S/P fall  . FRACTURE SURGERY    . LEFT HEART CATH AND CORONARY ANGIOGRAPHY N/A 04/16/2017   Procedure: Left Heart Cath and Coronary Angiography;  Surgeon: Nelva Bush, MD;  Location: [facility];  Service: Cardiovascular;  Laterality: N/A;    Current Outpatient Rx  . Order #: [id number] Class: No Print  . Order #: [id number] Class: No Print  . Order #: [id number] Class: Normal  . Order #: [id number] Class: Print  . Order #: [id number] Class: Print    Allergies Patient has no known allergies.  Family History  Problem Relation Age of Onset  . Lupus Mother   . Kidney failure Mother      Social History Social History   Tobacco Use  . Smoking status: Current Every Day Smoker    Packs/day: 0.50    Years: 28.00    Pack years: 14.00    Types: Cigarettes  . Smokeless tobacco: Never Used  Substance Use Topics  . Alcohol use: Yes    Alcohol/week: 7.2 oz    Types: 12 Cans of beer per week    Comment: 04/16/2017 "I drink on Friday nights only"  . Drug use: Yes    Types: Cocaine    Comment: 04/16/2017 "tried it 04/13/2017 for the 1st time; never again"    Review of Systems  All other systems negative except as documented in the HPI. All pertinent positives and negatives as reviewed in the HPI. ____________________________________________   PHYSICAL EXAM:  VITAL SIGNS: ED Triage Vitals  Enc Vitals Group     BP 12/24/17 0620 124/82     Pulse Rate 12/24/17 0620 73     Resp 12/24/17 0620 20     Temp 12/24/17 0620 98.1 F (36.7 C)     Temp Source 12/24/17 0620 Oral     SpO2 12/24/17 0620 100 %     Weight 12/24/17 0620 160 lb (72.6 kg)     Height 12/24/17 0620 5\' 7"  (1.702 m)    Constitutional: Alert and oriented. Well appearing and in no acute distress. Eyes: Conjunctivae are normal. PERRL. EOMI. Head: Atraumatic. Nose: No congestion/rhinnorhea.  Mouth/Throat: Mucous membranes are moist.  Oropharynx non-erythematous. Neck: No stridor.  No meningeal signs.   Cardiovascular: Normal rate, regular rhythm. Good peripheral circulation. Grossly normal heart sounds.   Respiratory: Normal respiratory effort.  No retractions. Lungs CTAB. Gastrointestinal: Soft and nontender. No distention.  Musculoskeletal: No lower extremity tenderness nor edema. No gross deformities of extremities. Anterior shoulder ttp up to top of shoulder. Pain worse with ROM of bicep flexion. Neurologic:  Normal speech and language. No gross focal neurologic deficits are appreciated.  Skin:  Skin is warm, dry and intact. No rash  noted.   ____________________________________________   INITIAL IMPRESSION / ASSESSMENT AND PLAN / ED COURSE  Suspect rotator cuff sprain versus biceps tendinitis.  No trauma or other concerning factors to indicate imaging at this time.  will start with anti-inflammatories and oral he will follow-up if not improving in a week.   Pertinent labs & imaging results that were available during my care of the patient were reviewed by me and considered in my medical decision making (see chart for details).  ____________________________________________  FINAL CLINICAL IMPRESSION(S) / ED DIAGNOSES  Final diagnoses:  Acute pain of right shoulder     MEDICATIONS GIVEN DURING THIS VISIT:  Medications  ketorolac (TORADOL) injection 60 mg (not administered)     NEW OUTPATIENT MEDICATIONS STARTED DURING THIS VISIT:  New Prescriptions   IBUPROFEN (ADVIL,MOTRIN) 800 MG TABLET    Take 1 tablet (800 mg total) by mouth 3 (three) times daily.    Note:  This note was prepared with assistance of Dragon voice recognition software. Occasional wrong-word or sound-a-like substitutions may have occurred due to the inherent limitations of voice recognition software.   Baldo Hufnagle, Corene Cornea, MD 12/24/17 [phone]

## 2017-12-24 NOTE — ED Triage Notes (Signed)
Pt has had R shoulder/upper arm pain for the past two days. 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 Pain worsens with movement. Denies chest pain or SOB.

## 2018-01-19 ENCOUNTER — Other Ambulatory Visit: Payer: Self-pay

## 2018-01-19 ENCOUNTER — Emergency Department (HOSPITAL_COMMUNITY)
Admission: EM | Admit: 2018-01-19 | Discharge: 2018-01-19 | Disposition: A | Discharge status: Home / Self Care | Payer: Self-pay | Attending: Emergency Medicine | Admitting: Emergency Medicine

## 2018-01-19 ENCOUNTER — Encounter (HOSPITAL_COMMUNITY): Payer: Self-pay

## 2018-01-19 DIAGNOSIS — J111 Influenza due to unidentified influenza virus with other respiratory manifestations: Secondary | ICD-10-CM | POA: Insufficient documentation

## 2018-01-19 DIAGNOSIS — F17210 Nicotine dependence, cigarettes, uncomplicated: Secondary | ICD-10-CM | POA: Insufficient documentation

## 2018-01-19 DIAGNOSIS — Z7982 Long term (current) use of aspirin: Secondary | ICD-10-CM | POA: Insufficient documentation

## 2018-01-19 DIAGNOSIS — Z79899 Other long term (current) drug therapy: Secondary | ICD-10-CM | POA: Insufficient documentation

## 2018-01-19 MED ORDER — OSELTAMIVIR PHOSPHATE 75 MG PO CAPS: 75.0000 mg | ORAL_CAPSULE | Freq: Two times a day (BID) | ORAL | 0 refills | Status: DC

## 2018-01-19 NOTE — ED Triage Notes (Signed)
Patient c/o nasal congestion yesterday and body aches this AM. Patient states symptoms are worse today.

## 2018-01-19 NOTE — ED Provider Notes (Signed)
[place] DEPT Provider Note   CSN: [id number] Arrival date & time: 01/19/18  1142     History   Chief Complaint Chief Complaint  Patient presents with  . Generalized Body Aches  . Nasal Congestion    HPI Derrick Jenkins is a 47 y.o. male.  47 year old male presents with 1 day of body aches and congestion.  Positive sick exposures.  Denies any vomiting or diarrhea.  No neck pain or photophobia.  No severe headaches.  No rashes.  Has used over-the-counter medications without relief.  Nothing makes his symptoms better.      No past medical history on file.  Patient Active Problem List   Diagnosis Date Noted  . Cocaine abuse ([place]) 04/17/2017  . Smoker 04/17/2017  . Low TSH level 04/17/2017  . Normal coronary arteries 04/17/2017  . Chest pain with high risk of acute coronary syndrome 04/16/2017  . Abnormal EKG 04/16/2017  . Unstable angina ([place]) 04/16/2017    Past Surgical History:  Procedure Laterality Date  . CARDIAC CATHETERIZATION    . ELBOW FRACTURE SURGERY Left 1997   S/P fall  . FRACTURE SURGERY    . LEFT HEART CATH AND CORONARY ANGIOGRAPHY N/A 04/16/2017   Procedure: Left Heart Cath and Coronary Angiography;  Surgeon: Nelva Bush, MD;  Location: [facility];  Service: Cardiovascular;  Laterality: N/A;       Home Medications    Prior to Admission medications   Medication Sig Start Date End Date Taking? Authorizing Provider  acetaminophen (TYLENOL) 325 MG tablet Take 2 tablets (650 mg total) by mouth every 4 (four) hours as needed for headache or mild pain. 04/17/17   Erlene Quan, PA-C  aspirin 81 MG chewable tablet Chew 1 tablet (81 mg total) by mouth daily. 04/17/17   Erlene Quan, PA-C  emtricitabine-tenofovir (TRUVADA) 200-300 MG tablet Take 1 tablet by mouth daily. 11/04/17   Truman Hayward, MD  ibuprofen (ADVIL,MOTRIN) 800 MG tablet Take 1 tablet (800 mg total) by mouth 3 (three) times daily. 12/24/17    Mesner, Corene Cornea, MD  nitroGLYCERIN (NITROSTAT) 0.4 MG SL tablet Place 1 tablet (0.4 mg total) under the tongue every 5 (five) minutes as needed for chest pain (CP or SOB). 04/17/17   Erlene Quan, PA-C    Family History Family History  Problem Relation Age of Onset  . Lupus Mother   . Kidney failure Mother     Social History Social History   Tobacco Use  . Smoking status: Current Every Day Smoker    Packs/day: 0.50    Years: 28.00    Pack years: 14.00    Types: Cigarettes  . Smokeless tobacco: Never Used  Substance Use Topics  . Alcohol use: Yes    Alcohol/week: 7.2 oz    Types: 12 Cans of beer per week    Comment: 04/16/2017 "I drink on Friday nights only"  . Drug use: Yes    Types: Cocaine    Comment: 04/16/2017 "tried it 04/13/2017 for the 1st time; never again"     Allergies   Patient has no known allergies.   Review of Systems Review of Systems  All other systems reviewed and are negative.    Physical Exam Updated Vital Signs BP 138/83   Pulse 71   Temp 98.5 F (36.9 C) (Oral)   Resp 15   Ht 1.702 m (5\' 7" )   Wt 72.6 kg (160 lb)   SpO2 100%  BMI 25.06 kg/m   Physical Exam  Constitutional: He is oriented to person, place, and time. He appears well-developed and well-nourished.  Non-toxic appearance. No distress.  HENT:  Head: Normocephalic and atraumatic.  Eyes: Conjunctivae, EOM and lids are normal. Pupils are equal, round, and reactive to light.  Neck: Normal range of motion. Neck supple. No tracheal deviation present. No thyroid mass present.  Cardiovascular: Normal rate, regular rhythm and normal heart sounds. Exam reveals no gallop.  No murmur heard. Pulmonary/Chest: Effort normal and breath sounds normal. No stridor. No respiratory distress. He has no decreased breath sounds. He has no wheezes. He has no rhonchi. He has no rales.  Abdominal: Soft. Normal appearance and bowel sounds are normal. He exhibits no distension. There is no tenderness.  There is no rebound and no CVA tenderness.  Musculoskeletal: Normal range of motion. He exhibits no edema or tenderness.  Neurological: He is alert and oriented to person, place, and time. He has normal strength. No cranial nerve deficit or sensory deficit. GCS eye subscore is 4. GCS verbal subscore is 5. GCS motor subscore is 6.  Skin: Skin is warm and dry. No abrasion and no rash noted.  Psychiatric: He has a normal mood and affect. His speech is normal and behavior is normal.  Nursing note and vitals reviewed.    ED Treatments / Results  Labs (all labs ordered are listed, but only abnormal results are displayed) Labs Reviewed - No data to display  EKG  EKG Interpretation None       Radiology No results found.  Procedures Procedures (including critical care time)  Medications Ordered in ED Medications - No data to display   Initial Impression / Assessment and Plan / ED Course  I have reviewed the triage vital signs and the nursing notes.  Pertinent labs & imaging results that were available during my care of the patient were reviewed by me and considered in my medical decision making (see chart for details).     Patient with likely influenza and will prescribe Tamiflu.  Final Clinical Impressions(s) / ED Diagnoses   Final diagnoses:  None    ED Discharge Orders    None       Lacretia Leigh, MD 01/19/18 1333

## 2018-01-19 NOTE — ED Notes (Signed)
4 calls for pt to get vitals and no response.

## 2019-08-27 ENCOUNTER — Encounter (HOSPITAL_COMMUNITY): Payer: Self-pay

## 2019-08-27 ENCOUNTER — Ambulatory Visit (HOSPITAL_COMMUNITY)
Admission: EM | Admit: 2019-08-27 | Discharge: 2019-08-27 | Disposition: A | Discharge status: Home / Self Care | Payer: Self-pay | Attending: Urgent Care | Admitting: Urgent Care

## 2019-08-27 ENCOUNTER — Other Ambulatory Visit: Payer: Self-pay

## 2019-08-27 DIAGNOSIS — M62838 Other muscle spasm: Secondary | ICD-10-CM

## 2019-08-27 DIAGNOSIS — M7701 Medial epicondylitis, right elbow: Secondary | ICD-10-CM

## 2019-08-27 DIAGNOSIS — M25521 Pain in right elbow: Secondary | ICD-10-CM

## 2019-08-27 DIAGNOSIS — M79601 Pain in right arm: Secondary | ICD-10-CM

## 2019-08-27 DIAGNOSIS — M7702 Medial epicondylitis, left elbow: Secondary | ICD-10-CM

## 2019-08-27 MED ORDER — CYCLOBENZAPRINE HCL 5 MG PO TABS: 5.0000 mg | ORAL_TABLET | Freq: Every evening | ORAL | 0 refills | Status: DC | PRN

## 2019-08-27 MED ORDER — NAPROXEN 500 MG PO TABS: 500.0000 mg | ORAL_TABLET | Freq: Two times a day (BID) | ORAL | 0 refills | Status: DC

## 2019-08-27 NOTE — ED Provider Notes (Signed)
MRN: [id number] DOB: 04/05/1971  Subjective:   Derrick Jenkins is a 48 y.o. male presenting for 1 month history of persistent right elbow and forearm pain.  Patient states that the symptoms have been worsening over the past month, occur randomly but are mostly constant.  He is now feeling some numbing type sensations that go down his forearm into his hand.  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  Denies falls, trauma, redness, swelling.  Denies neck pain but he does feel knots on his trapezius muscle.  Patient tries to hydrate very well every day.  He does smoke cigarettes about 1/2 pack to 1 pack/day.  Denies history of MI, CAD.  Denies taking any chronic medications.    No Known Allergies  History reviewed. No pertinent past medical history.   Past Surgical History:  Procedure Laterality Date  . CARDIAC CATHETERIZATION    . ELBOW FRACTURE SURGERY Left 1997   S/P fall  . FRACTURE SURGERY    . LEFT HEART CATH AND CORONARY ANGIOGRAPHY N/A 04/16/2017   Procedure: Left Heart Cath and Coronary Angiography;  Surgeon: Nelva Bush, MD;  Location: [facility];  Service: Cardiovascular;  Laterality: N/A;    ROS  Objective:   Vitals: BP 133/78 (BP Location: Left Arm)   Pulse 67   Temp 98.4 F (36.9 C) (Oral)   Resp 16   SpO2 100%   Physical Exam Constitutional:      Appearance: Normal appearance. He is well-developed and normal weight.  HENT:     Head: Normocephalic and atraumatic.     Right Ear: External ear normal.     Left Ear: External ear normal.     Nose: Nose normal.     Mouth/Throat:     Pharynx: Oropharynx is clear.  Eyes:     Extraocular Movements: Extraocular movements intact.     Pupils: Pupils are equal, round, and reactive to light.  Cardiovascular:     Rate and Rhythm: Normal rate.  Pulmonary:     Effort: Pulmonary effort is normal.  Musculoskeletal:     Right elbow: He exhibits  normal range of motion, no swelling, no effusion, no deformity and no laceration. Tenderness found. Medial epicondyle (With resisted supination and pronation of hand) and lateral epicondyle (On palpation only) tenderness noted. No radial head and no olecranon process tenderness noted.  Neurological:     Mental Status: He is alert and oriented to person, place, and time.  Psychiatric:        Mood and Affect: Mood normal.        Behavior: Behavior normal.     Assessment and Plan :   1. Right arm pain   2. Right elbow pain   3. Epicondylitis elbow, medial, right   4. Epicondylitis elbow, medial, left   5. Trapezius muscle spasm     Patient has tenderness at both medial and lateral epicondyles.  This was elicited with palpation and also resisted pronation/supination of his hand.  Patient has not taken any medications for this.  We will have him start naproxen and modify his activities/use of his right arm.  Recommended he wear an elbow strap to the area that affects him the most.  He is to return to clinic in 2 weeks if his problems persist, start prednisone course at that point and/or refer to Ortho.  Recommended patient continue hydrating well, use Flexeril for  spasms of his trapezius.  Counseled patient on potential for adverse effects with medications prescribed/recommended today, ER and return-to-clinic precautions discussed, patient verbalized understanding.    Jaynee Eagles, PA-C 08/27/19 1248

## 2019-08-27 NOTE — ED Triage Notes (Signed)
Pt present right arm pain that radiates form his elbow to his hand. Symptoms has been  Going on for over a month but has gotten worst since he started his new job.

## 2019-08-31 ENCOUNTER — Ambulatory Visit (HOSPITAL_COMMUNITY)
Admission: EM | Admit: 2019-08-31 | Discharge: 2019-08-31 | Disposition: A | Discharge status: Home / Self Care | Payer: Self-pay | Attending: Urgent Care | Admitting: Urgent Care

## 2019-08-31 ENCOUNTER — Other Ambulatory Visit: Payer: Self-pay

## 2019-08-31 ENCOUNTER — Encounter (HOSPITAL_COMMUNITY): Payer: Self-pay

## 2019-08-31 DIAGNOSIS — M7701 Medial epicondylitis, right elbow: Secondary | ICD-10-CM

## 2019-08-31 DIAGNOSIS — M25521 Pain in right elbow: Secondary | ICD-10-CM

## 2019-08-31 DIAGNOSIS — M7711 Lateral epicondylitis, right elbow: Secondary | ICD-10-CM

## 2019-08-31 MED ORDER — PREDNISONE 20 MG PO TABS: ORAL_TABLET | ORAL | 0 refills | Status: DC

## 2019-08-31 NOTE — ED Provider Notes (Signed)
MRN: [id number] DOB: [birth year]  Subjective:   Derrick Jenkins is a 48 y.o. male presenting for recheck on his epicondylitis.  At his last office visit patient was given a few days off work, started on Flexeril and naproxen for muscle cramps spasms and bilateral epicondylitis of his right elbow.  Since then patient reports improvement in his lateral elbow pain but still has significant pain about his right elbow.  Naproxen has helped but but feels his improvement has stalled.  His employer needs specific information for his work restrictions upon return to work.  Patient tried to go to occupational health but was turned away.  No current facility-administered medications for this encounter.   Current Outpatient Medications:  .  cyclobenzaprine (FLEXERIL) 5 MG tablet, Take 1 tablet (5 mg total) by mouth at bedtime as needed for muscle spasms., Disp: 60 tablet, Rfl: 0 .  naproxen (NAPROSYN) 500 MG tablet, Take 1 tablet (500 mg total) by mouth 2 (two) times daily., Disp: 30 tablet, Rfl: 0    No Known Allergies   History reviewed. No pertinent past medical history.    Past Surgical History:  Procedure Laterality Date  . CARDIAC CATHETERIZATION    . ELBOW FRACTURE SURGERY Left 1997   S/P fall  . FRACTURE SURGERY    . LEFT HEART CATH AND CORONARY ANGIOGRAPHY N/A 04/16/2017   Procedure: Left Heart Cath and Coronary Angiography;  Surgeon: Nelva Bush, MD;  Location: [facility];  Service: Cardiovascular;  Laterality: N/A;    ROS  Objective:   Vitals: BP 123/77 (BP Location: Left Arm)   Pulse 68   Temp 97.9 F (36.6 C) (Oral)   Resp 16   SpO2 98%   Physical Exam Constitutional:      Appearance: Normal appearance. He is well-developed and normal weight.  HENT:     Head: Normocephalic and atraumatic.     Right Ear: External ear normal.     Left Ear: External ear normal.     Nose: Nose normal.     Mouth/Throat:     Pharynx: Oropharynx is clear.  Eyes:   Extraocular Movements: Extraocular movements intact.     Pupils: Pupils are equal, round, and reactive to light.  Cardiovascular:     Rate and Rhythm: Normal rate.  Pulmonary:     Effort: Pulmonary effort is normal.  Musculoskeletal:     Right elbow: He exhibits decreased range of motion (Secondary to pain). He exhibits no swelling, no effusion, no deformity and no laceration. Tenderness found. Medial epicondyle (With resisted pronation and supination) tenderness noted. No radial head, no lateral epicondyle (Resolved from last office visit) and no olecranon process tenderness noted.  Neurological:     Mental Status: He is alert and oriented to person, place, and time.  Psychiatric:        Mood and Affect: Mood normal.        Behavior: Behavior normal.     Assessment and Plan :   1. Medial epicondylitis of right elbow   2. Epicondylitis, lateral, right   3. Right elbow pain     Lateral epicondylitis improved/resolved.  Patient still has significant medial epicondylitis.  We will have him start prednisone course, wrote for specific work restrictions.  He can maintain Flexeril and restart naproxen once he is done with the prednisone course.  Patient will follow-up with me here since he was turned away from occupational health in 1 week. Counseled patient on potential for adverse effects with medications prescribed/recommended  today, ER and return-to-clinic precautions discussed, patient verbalized understanding.    Jaynee Eagles, PA-C 08/31/19 1109

## 2019-08-31 NOTE — ED Notes (Signed)
1 page of work related paperwork has been copied for scanning into medical record

## 2019-08-31 NOTE — ED Triage Notes (Signed)
Patient present follow up and he also wants a note work  That specified he can return to work with not restriction.

## 2019-09-07 ENCOUNTER — Ambulatory Visit (HOSPITAL_COMMUNITY)
Admission: EM | Admit: 2019-09-07 | Discharge: 2019-09-07 | Disposition: A | Discharge status: Home / Self Care | Payer: Self-pay

## 2019-09-07 ENCOUNTER — Encounter (HOSPITAL_COMMUNITY): Payer: Self-pay

## 2019-09-07 ENCOUNTER — Other Ambulatory Visit: Payer: Self-pay

## 2019-09-07 DIAGNOSIS — Z0289 Encounter for other administrative examinations: Secondary | ICD-10-CM

## 2019-09-07 DIAGNOSIS — Z7689 Persons encountering health services in other specified circumstances: Secondary | ICD-10-CM

## 2019-09-07 NOTE — Discharge Instructions (Signed)
safe to return to work with no restitutions.

## 2019-09-07 NOTE — ED Provider Notes (Signed)
[facility]    CSN: [id number] Arrival date & time: 09/07/19  1328      History   Chief Complaint No chief complaint on file.   HPI Derrick Jenkins is a 48 y.o. male.   Pt is a 48 year old male that presents for return work evaluation. He would like to be fully cleared with no restrictions. He is feeling much better.      History reviewed. No pertinent past medical history.  Patient Active Problem List   Diagnosis Date Noted  . Cocaine abuse ([place]) 04/17/2017  . Smoker 04/17/2017  . Low TSH level 04/17/2017  . Normal coronary arteries 04/17/2017  . Chest pain with high risk of acute coronary syndrome 04/16/2017  . Abnormal EKG 04/16/2017  . Unstable angina ([place]) 04/16/2017    Past Surgical History:  Procedure Laterality Date  . CARDIAC CATHETERIZATION    . ELBOW FRACTURE SURGERY Left 1997   S/P fall  . FRACTURE SURGERY    . LEFT HEART CATH AND CORONARY ANGIOGRAPHY N/A 04/16/2017   Procedure: Left Heart Cath and Coronary Angiography;  Surgeon: Nelva Bush, MD;  Location: [facility];  Service: Cardiovascular;  Laterality: N/A;       Home Medications    Prior to Admission medications   Medication Sig Start Date End Date Taking? Authorizing Provider  cyclobenzaprine (FLEXERIL) 5 MG tablet Take 1 tablet (5 mg total) by mouth at bedtime as needed for muscle spasms. 08/27/19   Jaynee Eagles, PA-C  naproxen (NAPROSYN) 500 MG tablet Take 1 tablet (500 mg total) by mouth 2 (two) times daily. 08/27/19   Jaynee Eagles, PA-C  predniSONE (DELTASONE) 20 MG tablet Take 2 tablets daily with breakfast. 08/31/19   Jaynee Eagles, PA-C  nitroGLYCERIN (NITROSTAT) 0.4 MG SL tablet Place 1 tablet (0.4 mg total) under the tongue every 5 (five) minutes as needed for chest pain (CP or SOB). 04/17/17 08/27/19  Erlene Quan, PA-C    Family History Family History  Problem Relation Age of Onset  . Lupus Mother   . Kidney failure Mother     Social History Social History    Tobacco Use  . Smoking status: Current Every Day Smoker    Packs/day: 0.50    Years: 28.00    Pack years: 14.00    Types: Cigarettes  . Smokeless tobacco: Never Used  Substance Use Topics  . Alcohol use: Yes    Alcohol/week: 12.0 standard drinks    Types: 12 Cans of beer per week    Comment: 04/16/2017 "I drink on Friday nights only"  . Drug use: No    Types: Cocaine    Comment: 04/16/2017 "tried it 04/13/2017 for the 1st time; never again"     Allergies   Patient has no known allergies.   Review of Systems Review of Systems   Physical Exam Triage Vital Signs ED Triage Vitals  Enc Vitals Group     BP 09/07/19 1451 120/79     Pulse Rate 09/07/19 1451 84     Resp 09/07/19 1450 16     Temp 09/07/19 1451 99 F (37.2 C)     Temp Source 09/07/19 1451 Tympanic     SpO2 09/07/19 1451 100 %     Weight --      Height --      Head Circumference --      Peak Flow --      Pain Score 09/07/19 1450 0     Pain  Loc --      Pain Edu? --      Excl. in [place]? --    No data found.  Updated Vital Signs BP 120/79 (BP Location: Left Arm)   Pulse 84   Temp 99 F (37.2 C) (Tympanic)   Resp 16   SpO2 100%   Visual Acuity Right Eye Distance:   Left Eye Distance:   Bilateral Distance:    Right Eye Near:   Left Eye Near:    Bilateral Near:     Physical Exam Vitals signs and nursing note reviewed.  Constitutional:      Appearance: Normal appearance.  HENT:     Head: Normocephalic and atraumatic.     Nose: Nose normal.  Eyes:     Conjunctiva/sclera: Conjunctivae normal.  Neck:     Musculoskeletal: Normal range of motion.  Pulmonary:     Effort: Pulmonary effort is normal.  Musculoskeletal: Normal range of motion.        General: No swelling or tenderness.  Skin:    General: Skin is warm and dry.  Neurological:     Mental Status: He is alert.  Psychiatric:        Mood and Affect: Mood normal.      UC Treatments / Results  Labs (all labs ordered are listed, but  only abnormal results are displayed) Labs Reviewed - No data to display  EKG   Radiology No results found.  Procedures Procedures (including critical care time)  Medications Ordered in UC Medications - No data to display  Initial Impression / Assessment and Plan / UC Course  I have reviewed the triage vital signs and the nursing notes.  Pertinent labs & imaging results that were available during my care of the patient were reviewed by me and considered in my medical decision making (see chart for details).     Return to work- pt safe to return to work with no restriction.  Final Clinical Impressions(s) / UC Diagnoses   Final diagnoses:  Return to work evaluation     Discharge Instructions     safe to return to work with no restitutions.     ED Prescriptions    None     PDMP not reviewed this encounter.   Loura Halt A, NP 09/07/19 1510

## 2019-09-07 NOTE — ED Triage Notes (Signed)
Pt presents to UC stating he came in to get right arm rechecked to be cleared to go back to work.

## 2021-01-24 ENCOUNTER — Emergency Department (HOSPITAL_COMMUNITY): Payer: Self-pay

## 2021-01-24 ENCOUNTER — Encounter (HOSPITAL_COMMUNITY): Payer: Self-pay | Admitting: Emergency Medicine

## 2021-01-24 ENCOUNTER — Emergency Department (HOSPITAL_COMMUNITY)
Admission: EM | Admit: 2021-01-24 | Discharge: 2021-01-24 | Disposition: A | Discharge status: Home / Self Care | Payer: Self-pay | Attending: Emergency Medicine | Admitting: Emergency Medicine

## 2021-01-24 DIAGNOSIS — Z9861 Coronary angioplasty status: Secondary | ICD-10-CM | POA: Insufficient documentation

## 2021-01-24 DIAGNOSIS — M25512 Pain in left shoulder: Secondary | ICD-10-CM | POA: Insufficient documentation

## 2021-01-24 MED ORDER — IBUPROFEN 800 MG PO TABS: 800.0000 mg | ORAL_TABLET | Freq: Three times a day (TID) | ORAL | 0 refills | Status: DC | PRN

## 2021-01-24 MED ORDER — CYCLOBENZAPRINE HCL 10 MG PO TABS: 10.0000 mg | ORAL_TABLET | Freq: Three times a day (TID) | ORAL | 0 refills | Status: DC | PRN

## 2021-01-24 MED ORDER — IBUPROFEN 800 MG PO TABS
800.0000 mg | ORAL_TABLET | Freq: Once | ORAL | Status: AC
  Administered 2021-01-24: 800 mg via ORAL
  Filled 2021-01-24: qty 1

## 2021-01-24 NOTE — ED Triage Notes (Signed)
Pt here from home with c/o left shoulder and upper back pain ,no trauma noted , pain is worse with movement and palpation

## 2021-01-24 NOTE — ED Provider Notes (Signed)
[facility] EMERGENCY DEPARTMENT Provider Note   CSN: [id number] Arrival date & time: 01/24/21  7253     History Chief Complaint  Patient presents with  . Shoulder Pain    Derrick Jenkins is a 50 y.o. male.  The history is provided by the patient.  Shoulder Pain Location:  Shoulder Shoulder location:  L shoulder Pain details:    Quality:  Aching   Radiates to:  Does not radiate   Severity:  Mild   Onset quality:  Gradual   Timing:  Intermittent   Progression:  Waxing and waning Relieved by:  Nothing Worsened by:  Movement Associated symptoms: no back pain, no decreased range of motion, no fever, no muscle weakness, no neck pain, no numbness, no stiffness, no swelling and no tingling        History reviewed. No pertinent past medical history.  Patient Active Problem List   Diagnosis Date Noted  . Cocaine abuse ([place]) 04/17/2017  . Smoker 04/17/2017  . Low TSH level 04/17/2017  . Normal coronary arteries 04/17/2017  . Chest pain with high risk of acute coronary syndrome 04/16/2017  . Abnormal EKG 04/16/2017  . Unstable angina ([place]) 04/16/2017    Past Surgical History:  Procedure Laterality Date  . CARDIAC CATHETERIZATION    . ELBOW FRACTURE SURGERY Left 1997   S/P fall  . FRACTURE SURGERY    . LEFT HEART CATH AND CORONARY ANGIOGRAPHY N/A 04/16/2017   Procedure: Left Heart Cath and Coronary Angiography;  Surgeon: Nelva Bush, MD;  Location: [place] [facility];  Service: Cardiovascular;  Laterality: N/A;       Family History  Problem Relation Age of Onset  . Lupus Mother   . Kidney failure Mother     Social History   Tobacco Use  . Smoking status: Current Every Day Smoker    Packs/day: 0.50    Years: 28.00    Pack years: 14.00    Types: Cigarettes  . Smokeless tobacco: Never Used  Vaping Use  . Vaping Use: Never used  Substance Use Topics  . Alcohol use: Yes    Alcohol/week: 12.0 standard drinks    Types: 12 Cans of beer  per week    Comment: 04/16/2017 "I drink on Friday nights only"  . Drug use: No    Types: Cocaine    Comment: 04/16/2017 "tried it 04/13/2017 for the 1st time; never again"    Home Medications Prior to Admission medications   Medication Sig Start Date End Date Taking? Authorizing Provider  cyclobenzaprine (FLEXERIL) 10 MG tablet Take 1 tablet (10 mg total) by mouth 3 (three) times daily as needed for up to 20 doses for muscle spasms. 01/24/21  Yes Karly Pitter, DO  ibuprofen (ADVIL) 800 MG tablet Take 1 tablet (800 mg total) by mouth every 8 (eight) hours as needed for up to 30 doses. 01/24/21  Yes Luana Tatro, DO  naproxen (NAPROSYN) 500 MG tablet Take 1 tablet (500 mg total) by mouth 2 (two) times daily. 08/27/19   Jaynee Eagles, PA-C  predniSONE (DELTASONE) 20 MG tablet Take 2 tablets daily with breakfast. 08/31/19   Jaynee Eagles, PA-C  nitroGLYCERIN (NITROSTAT) 0.4 MG SL tablet Place 1 tablet (0.4 mg total) under the tongue every 5 (five) minutes as needed for chest pain (CP or SOB). 04/17/17 08/27/19  Erlene Quan, PA-C    Allergies    Patient has no known allergies.  Review of Systems   Review of Systems  Constitutional:  Negative for chills and fever.  HENT: Negative for ear pain and sore throat.   Eyes: Negative for pain and visual disturbance.  Respiratory: Negative for cough and shortness of breath.   Cardiovascular: Positive for chest pain (anterior chest wall). Negative for palpitations.  Gastrointestinal: Negative for abdominal pain and vomiting.  Genitourinary: Negative for dysuria and hematuria.  Musculoskeletal: Positive for arthralgias. Negative for back pain, neck pain and stiffness.  Skin: Negative for color change and rash.  Neurological: Negative for seizures and syncope.  All other systems reviewed and are negative.   Physical Exam Updated Vital Signs BP (!) 137/91 (BP Location: Left Arm)   Pulse (!) 59   Temp 97.6 F (36.4 C) (Oral)   Resp 18   SpO2 100%    Physical Exam Vitals and nursing note reviewed.  Constitutional:      General: He is not in acute distress.    Appearance: He is well-developed and well-nourished. He is not ill-appearing.  HENT:     Head: Normocephalic and atraumatic.     Nose: Nose normal.     Mouth/Throat:     Mouth: Mucous membranes are moist.  Eyes:     Extraocular Movements: Extraocular movements intact.     Conjunctiva/sclera: Conjunctivae normal.     Pupils: Pupils are equal, round, and reactive to light.  Cardiovascular:     Rate and Rhythm: Normal rate and regular rhythm.     Pulses: Normal pulses.     Heart sounds: Normal heart sounds. No murmur heard.   Pulmonary:     Effort: Pulmonary effort is normal. No respiratory distress.     Breath sounds: Normal breath sounds.  Abdominal:     Palpations: Abdomen is soft.     Tenderness: There is no abdominal tenderness.  Musculoskeletal:        General: Tenderness present. No edema.     Cervical back: Normal range of motion and neck supple.     Comments: Tenderness within the left shoulder area, left scapular area and anterior chest wall  Skin:    General: Skin is warm and dry.     Capillary Refill: Capillary refill takes less than 2 seconds.  Neurological:     General: No focal deficit present.     Mental Status: He is alert.     Sensory: No sensory deficit.     Motor: No weakness.  Psychiatric:        Mood and Affect: Mood and affect normal.     ED Results / Procedures / Treatments   Labs (all labs ordered are listed, but only abnormal results are displayed) Labs Reviewed - No data to display  EKG EKG Interpretation  Date/Time:  Thursday January 24 2021 08:20:21 EST Ventricular Rate:  58 PR Interval:  152 QRS Duration: 88 QT Interval:  386 QTC Calculation: 378 R Axis:   60 Text Interpretation: Sinus bradycardia Minimal voltage criteria for LVH, may be normal variant ( Sokolow-Lyon ) Borderline ECG artificat in later leads, will  repeat Confirmed by Lennice Sites [phone]) on 01/24/2021 8:29:21 AM   Radiology DG Chest 2 View  Result Date: 01/24/2021 CLINICAL DATA:  LEFT shoulder pain, posterior LEFT chest pain radiating to anterior chest since yesterday, pain worse with raising LEFT arm, no known injury EXAM: CHEST - 2 VIEW COMPARISON:  04/16/2017 FINDINGS: Normal heart size, mediastinal contours, and pulmonary vascularity. Central peribronchial thickening, mild. Lungs clear. No acute infiltrate, pleural effusion, or pneumothorax. Osseous structures unremarkable. IMPRESSION: Mild bronchitic  changes without infiltrate. Electronically Signed   By: Lavonia Dana M.D.   On: 01/24/2021 08:17   DG Shoulder Left  Result Date: 01/24/2021 CLINICAL DATA:  LEFT shoulder pain, posterior LEFT chest pain radiating to anterior chest, pain with raising LEFT arm EXAM: LEFT SHOULDER - 2+ VIEW COMPARISON:  None FINDINGS: Osseous mineralization normal. AC joint alignment normal. No acute fracture, dislocation or bone destruction. Visualized ribs unremarkable. IMPRESSION: Normal exam. Electronically Signed   By: Lavonia Dana M.D.   On: 01/24/2021 08:18    Procedures Procedures   Medications Ordered in ED Medications  ibuprofen (ADVIL) tablet 800 mg (800 mg Oral Given 01/24/21 7253)    ED Course  I have reviewed the triage vital signs and the nursing notes.  Pertinent labs & imaging results that were available during my care of the patient were reviewed by me and considered in my medical decision making (see chart for details).    MDM Rules/Calculators/A&P                          Jazier Mcglamery is a 50 year old male who presents the ED with left shoulder pain.  History of cocaine abuse in the past.  Normal vitals.  No fever.  Having mostly left shoulder pain.  Left anterior chest wall pain.  Worse with movement.  Worse on palpation.  Neurologically, neuromuscularly intact.  EKG shows sinus rhythm with benign repolarization and most of  the leads.  Unchanged from prior EKG.  This was reviewed with cardiology and do not believe there is any acute ST elevation.  Does not fit the clinical picture as well.  Feeling better after Motrin.  Will prescribe anti-inflammatories and muscle relaxant.  No concern for pericarditis.  Chest x-ray and shoulder x-ray were unremarkable.  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  Discharged in ED in good condition.  Understands return precautions.  This chart was dictated using voice recognition software.  Despite best efforts to proofread,  errors can occur which can change the documentation meaning.   Final Clinical Impression(s) / ED Diagnoses Final diagnoses:  Acute pain of left shoulder    Rx / DC Orders ED Discharge Orders         Ordered    ibuprofen (ADVIL) 800 MG tablet  Every 8 hours PRN        01/24/21 0910    cyclobenzaprine (FLEXERIL) 10 MG tablet  3 times daily PRN        01/24/21 0910           Lennice Sites, DO 01/24/21 [phone]

## 2021-01-24 NOTE — ED Notes (Signed)
Patient transported to x-ray. ?

## 2021-05-06 ENCOUNTER — Encounter (HOSPITAL_COMMUNITY): Payer: Self-pay | Admitting: Emergency Medicine

## 2021-05-06 ENCOUNTER — Other Ambulatory Visit: Payer: Self-pay

## 2021-05-06 ENCOUNTER — Emergency Department (HOSPITAL_COMMUNITY)
Admission: EM | Admit: 2021-05-06 | Discharge: 2021-05-06 | Disposition: A | Discharge status: Home / Self Care | Payer: BC Managed Care – PPO | Attending: Emergency Medicine | Admitting: Emergency Medicine

## 2021-05-06 DIAGNOSIS — R202 Paresthesia of skin: Secondary | ICD-10-CM | POA: Insufficient documentation

## 2021-05-06 DIAGNOSIS — M25512 Pain in left shoulder: Secondary | ICD-10-CM | POA: Insufficient documentation

## 2021-05-06 DIAGNOSIS — U071 COVID-19: Secondary | ICD-10-CM | POA: Insufficient documentation

## 2021-05-06 DIAGNOSIS — M25511 Pain in right shoulder: Secondary | ICD-10-CM | POA: Diagnosis not present

## 2021-05-06 DIAGNOSIS — H6121 Impacted cerumen, right ear: Secondary | ICD-10-CM | POA: Diagnosis not present

## 2021-05-06 DIAGNOSIS — M549 Dorsalgia, unspecified: Secondary | ICD-10-CM | POA: Insufficient documentation

## 2021-05-06 DIAGNOSIS — F17210 Nicotine dependence, cigarettes, uncomplicated: Secondary | ICD-10-CM | POA: Insufficient documentation

## 2021-05-06 DIAGNOSIS — Z955 Presence of coronary angioplasty implant and graft: Secondary | ICD-10-CM | POA: Diagnosis not present

## 2021-05-06 DIAGNOSIS — R079 Chest pain, unspecified: Secondary | ICD-10-CM | POA: Diagnosis present

## 2021-05-06 LAB — COMPREHENSIVE METABOLIC PANEL
ALT: 17 U/L (ref 0–44)
AST: 16 U/L (ref 15–41)
Albumin: 3.7 g/dL (ref 3.5–5.0)
Alkaline Phosphatase: 56 U/L (ref 38–126)
Anion gap: 8 (ref 5–15)
BUN: 11 mg/dL (ref 6–20)
CO2: 27 mmol/L (ref 22–32)
Calcium: 9.2 mg/dL (ref 8.9–10.3)
Chloride: 102 mmol/L (ref 98–111)
Creatinine, Ser: 1.16 mg/dL (ref 0.61–1.24)
GFR, Estimated: >60 mL/min (ref >60)
Glucose, Bld: 100 mg/dL — ABNORMAL HIGH (ref 70–99)
Potassium: 4.2 mmol/L (ref 3.5–5.1)
Sodium: 137 mmol/L (ref 135–145)
Total Bilirubin: 0.3 mg/dL (ref 0.3–1.2)
Total Protein: 6.8 g/dL (ref 6.5–8.1)

## 2021-05-06 LAB — CBC WITH DIFFERENTIAL/PLATELET
Abs Immature Granulocytes: 0.03 10*3/uL (ref 0.00–0.07)
Basophils Absolute: 0 10*3/uL (ref 0.0–0.1)
Basophils Relative: 0 %
Eosinophils Absolute: 0 10*3/uL (ref 0.0–0.5)
Eosinophils Relative: 0 %
HCT: 42.6 % (ref 39.0–52.0)
Hemoglobin: 13.7 g/dL (ref 13.0–17.0)
Immature Granulocytes: 0 %
Lymphocytes Relative: 25 %
Lymphs Abs: 1.8 10*3/uL (ref 0.7–4.0)
MCH: 26.9 pg (ref 26.0–34.0)
MCHC: 32.2 g/dL (ref 30.0–36.0)
MCV: 83.7 fL (ref 80.0–100.0)
Monocytes Absolute: 1.5 10*3/uL — ABNORMAL HIGH (ref 0.1–1.0)
Monocytes Relative: 20 %
Neutro Abs: 3.9 10*3/uL (ref 1.7–7.7)
Neutrophils Relative %: 55 %
Platelets: 256 10*3/uL (ref 150–400)
RBC: 5.09 MIL/uL (ref 4.22–5.81)
RDW: 15.5 % (ref 11.5–15.5)
WBC: 7.2 10*3/uL (ref 4.0–10.5)
nRBC: 0 % (ref 0.0–0.2)

## 2021-05-06 LAB — RESP PANEL BY RT-PCR (FLU A&B, COVID) ARPGX2
Influenza A by PCR: NEGATIVE
Influenza B by PCR: NEGATIVE
SARS Coronavirus 2 by RT PCR: POSITIVE — AB

## 2021-05-06 MED ORDER — ACETAMINOPHEN 500 MG PO TABS
1000.0000 mg | ORAL_TABLET | Freq: Once | ORAL | Status: AC
  Administered 2021-05-06: 1000 mg via ORAL
  Filled 2021-05-06: qty 2

## 2021-05-06 NOTE — ED Triage Notes (Signed)
:  Pt reports back and bilateral back pain that started yesterday and has continued today.  Pt ambulatory in triage.

## 2021-05-06 NOTE — ED Provider Notes (Signed)
[facility] EMERGENCY DEPARTMENT Provider Note   CSN: [id number] Arrival date & time: 05/06/21  5093     History Chief Complaint  Patient presents with  . Back Pain    Derrick Jenkins is a 50 y.o. male.  Patient is a 50 year old male with a history of prior cocaine use years ago, normal coronary arteries on catheterization when having chest pain who has no other known medical problems but does continue to use tobacco presenting today with several complaints.  Patient reports yesterday around 1 PM he started having tingling and discomfort in the back of his legs and then as the day progressed by 6 or 7 PM he was having back pain, headache, skin pain and anterior shoulder pains.  He felt chilled and had significant decreased in his appetite.  He really did not eat much yesterday but reports he did drink [facility] and some water.  He has had no dysuria, frequency or urgency.  No diarrhea, nausea or vomiting.  No significant cough but intermittent sore throat.  He has not taken any medication for the symptoms.  He does report that where he works last week a large amount of people were sent home for [place].  He did receive the [employer] vaccine but no booster.  He denies any chest or abdominal pain.  No shortness of breath.  The history is provided by the patient.  Back Pain      History reviewed. No pertinent past medical history.  Patient Active Problem List   Diagnosis Date Noted  . Cocaine abuse ([place]) 04/17/2017  . Smoker 04/17/2017  . Low TSH level 04/17/2017  . Normal coronary arteries 04/17/2017  . Chest pain with high risk of acute coronary syndrome 04/16/2017  . Abnormal EKG 04/16/2017  . Unstable angina ([place]) 04/16/2017    Past Surgical History:  Procedure Laterality Date  . CARDIAC CATHETERIZATION    . ELBOW FRACTURE SURGERY Left 1997   S/P fall  . FRACTURE SURGERY    . LEFT HEART CATH AND CORONARY ANGIOGRAPHY N/A 04/16/2017   Procedure:  Left Heart Cath and Coronary Angiography;  Surgeon: Nelva Bush, MD;  Location: [place] [facility];  Service: Cardiovascular;  Laterality: N/A;       Family History  Problem Relation Age of Onset  . Lupus Mother   . Kidney failure Mother     Social History   Tobacco Use  . Smoking status: Current Every Day Smoker    Packs/day: 0.50    Years: 28.00    Pack years: 14.00    Types: Cigarettes  . Smokeless tobacco: Never Used  Vaping Use  . Vaping Use: Never used  Substance Use Topics  . Alcohol use: Yes    Alcohol/week: 12.0 standard drinks    Types: 12 Cans of beer per week    Comment: 04/16/2017 "I drink on Friday nights only"  . Drug use: No    Types: Cocaine    Comment: 04/16/2017 "tried it 04/13/2017 for the 1st time; never again"    Home Medications Prior to Admission medications   Medication Sig Start Date End Date Taking? Authorizing Provider  cyclobenzaprine (FLEXERIL) 10 MG tablet Take 1 tablet (10 mg total) by mouth 3 (three) times daily as needed for up to 20 doses for muscle spasms. 01/24/21   Curatolo, Adam, DO  ibuprofen (ADVIL) 800 MG tablet Take 1 tablet (800 mg total) by mouth every 8 (eight) hours as needed for up to 30  doses. 01/24/21   Curatolo, Adam, DO  naproxen (NAPROSYN) 500 MG tablet Take 1 tablet (500 mg total) by mouth 2 (two) times daily. 08/27/19   Jaynee Eagles, PA-C  predniSONE (DELTASONE) 20 MG tablet Take 2 tablets daily with breakfast. 08/31/19   Jaynee Eagles, PA-C  nitroGLYCERIN (NITROSTAT) 0.4 MG SL tablet Place 1 tablet (0.4 mg total) under the tongue every 5 (five) minutes as needed for chest pain (CP or SOB). 04/17/17 08/27/19  Erlene Quan, PA-C    Allergies    Patient has no known allergies.  Review of Systems   Review of Systems  Musculoskeletal: Positive for back pain.  All other systems reviewed and are negative.   Physical Exam Updated Vital Signs BP 136/78   Pulse 75   Temp 99.7 F (37.6 C)   Resp 16   SpO2 100%    Physical Exam Vitals and nursing note reviewed.  Constitutional:      General: He is not in acute distress.    Appearance: He is well-developed.  HENT:     Head: Normocephalic and atraumatic.     Right Ear: There is impacted cerumen.     Nose: Congestion present. No rhinorrhea.     Mouth/Throat:     Mouth: Mucous membranes are moist.     Pharynx: No oropharyngeal exudate or posterior oropharyngeal erythema.  Eyes:     Conjunctiva/sclera: Conjunctivae normal.     Pupils: Pupils are equal, round, and reactive to light.  Cardiovascular:     Rate and Rhythm: Normal rate and regular rhythm.     Pulses: Normal pulses.     Heart sounds: No murmur heard.   Pulmonary:     Effort: Pulmonary effort is normal. No respiratory distress.     Breath sounds: Normal breath sounds. No wheezing or rales.  Abdominal:     General: There is no distension.     Palpations: Abdomen is soft.     Tenderness: There is no abdominal tenderness. There is no guarding or rebound.  Musculoskeletal:        General: Tenderness present. Normal range of motion.     Cervical back: Normal range of motion and neck supple. No spinous process tenderness or muscular tenderness.     Comments: Tenderness with palpation in the thoracic and lumbar paraspinal muscles, tenderness with palpation of bilateral deltoids and bilateral hamstrings and quadriceps.  Skin:    General: Skin is warm and dry.     Findings: No erythema or rash.  Neurological:     Mental Status: He is alert and oriented to person, place, and time. Mental status is at baseline.     Cranial Nerves: No cranial nerve deficit.     Sensory: No sensory deficit.     Motor: No weakness.     Coordination: Coordination normal.     Gait: Gait normal.  Psychiatric:        Mood and Affect: Mood normal.        Behavior: Behavior normal.     ED Results / Procedures / Treatments   Labs (all labs ordered are listed, but only abnormal results are displayed) Labs  Reviewed  RESP PANEL BY RT-PCR (FLU A&B, COVID) ARPGX2 - Abnormal; Notable for the following components:      Result Value   SARS Coronavirus 2 by RT PCR POSITIVE (*)    All other components within normal limits  CBC WITH DIFFERENTIAL/PLATELET - Abnormal; Notable for the following components:   Monocytes Absolute  1.5 (*)    All other components within normal limits  COMPREHENSIVE METABOLIC PANEL - Abnormal; Notable for the following components:   Glucose, Bld 100 (*)    All other components within normal limits    EKG None  Radiology No results found.  Procedures Procedures   Medications Ordered in ED Medications  acetaminophen (TYLENOL) tablet 1,000 mg (has no administration in time range)    ED Course  I have reviewed the triage vital signs and the nursing notes.  Pertinent labs & imaging results that were available during my care of the patient were reviewed by me and considered in my medical decision making (see chart for details).    MDM Rules/Calculators/A&P                          Patient presenting with abrupt symptoms starting at 1 PM yesterday of general malaise, muscle aches and pains as well as back pain.  On exam patient has 5 out of 5 strength in bilateral lower extremities and intact sensation.  Pulses are intact in all 4 extremities.  He is well-appearing with normal vital signs except for a temperature of 99.7.  Low suspicion at this time for acute spinal pathology such as epidural abscess, discitis or osteomyelitis.  Low suspicion for cauda equina.  Patient is not displaying significant evidence for meningitis or encephalitis.  He denies any chest pain or shortness of breath.  Exam is normal except for pain with palpation of his large muscle groups.  COVID test is pending.  Patient has not had lab work done since 2018 and has not seen a doctor for regular visit for at least 4 years.  Will check baseline labs to ensure no other acute pathology.  Patient given  Tylenol for fever and body aches.  9:48 AM Labs reassuring except for pt being positive for COVID.  Suspect that is the cause of his sx today.  No indication for oral antivirals at this time.  Patient is otherwise well-appearing.  We will plan on discharge home with return precautions.  MDM Number of Diagnoses or Management Options   Amount and/or Complexity of Data Reviewed Clinical lab tests: ordered and reviewed Independent visualization of images, tracings, or specimens: yes    Final Clinical Impression(s) / ED Diagnoses Final diagnoses:  COVID    Rx / DC Orders ED Discharge Orders    None       Blanchie Dessert, MD 05/06/21 [phone]

## 2021-05-06 NOTE — ED Notes (Signed)
Pt d/c home per MD order. Discharge summary reviewed with pt, pt verbalizes understanding. No s/s of acute distress noted at discharge. Ambulatory off unit.

## 2021-05-06 NOTE — Discharge Instructions (Signed)
You did test positive for COVID today but your other labs looked good.  You need to take Tylenol every 6 hours as needed for body aches and pains.  Also make sure you are getting plenty of rest and drinking lots of fluid.  You should be feeling better in the next 4 to 5 days.  If you start having profuse vomiting, difficulty breathing or other concerns please return to the emergency room.

## 2021-11-19 ENCOUNTER — Other Ambulatory Visit: Payer: Self-pay

## 2021-11-19 ENCOUNTER — Ambulatory Visit (INDEPENDENT_AMBULATORY_CARE_PROVIDER_SITE_OTHER): Payer: BC Managed Care – PPO | Admitting: Emergency Medicine

## 2021-11-19 ENCOUNTER — Encounter: Payer: Self-pay | Admitting: Emergency Medicine

## 2021-11-19 VITALS — BP 120/68 | HR 94 | Temp 99.8°F | Ht 67.0 in | Wt 153.0 lb

## 2021-11-19 DIAGNOSIS — Z23 Encounter for immunization: Secondary | ICD-10-CM

## 2021-11-19 DIAGNOSIS — Z13228 Encounter for screening for other metabolic disorders: Secondary | ICD-10-CM

## 2021-11-19 DIAGNOSIS — Z13220 Encounter for screening for lipoid disorders: Secondary | ICD-10-CM | POA: Diagnosis not present

## 2021-11-19 DIAGNOSIS — Z1329 Encounter for screening for other suspected endocrine disorder: Secondary | ICD-10-CM

## 2021-11-19 DIAGNOSIS — Z1211 Encounter for screening for malignant neoplasm of colon: Secondary | ICD-10-CM | POA: Diagnosis not present

## 2021-11-19 DIAGNOSIS — Z1159 Encounter for screening for other viral diseases: Secondary | ICD-10-CM

## 2021-11-19 DIAGNOSIS — Z130 Encounter for screening for diseases of the blood and blood-forming organs and certain disorders involving the immune mechanism: Secondary | ICD-10-CM | POA: Diagnosis not present

## 2021-11-19 DIAGNOSIS — Z114 Encounter for screening for human immunodeficiency virus [HIV]: Secondary | ICD-10-CM

## 2021-11-19 DIAGNOSIS — Z0000 Encounter for general adult medical examination without abnormal findings: Secondary | ICD-10-CM

## 2021-11-19 LAB — CBC WITH DIFFERENTIAL/PLATELET
Basophils Absolute: 0 10*3/uL (ref 0.0–0.1)
Basophils Relative: 0.6 % (ref 0.0–3.0)
Eosinophils Absolute: 0 10*3/uL (ref 0.0–0.7)
Eosinophils Relative: 0.4 % (ref 0.0–5.0)
HCT: 44.5 % (ref 39.0–52.0)
Hemoglobin: 14.6 g/dL (ref 13.0–17.0)
Lymphocytes Relative: 28.3 % (ref 12.0–46.0)
Lymphs Abs: 2.2 10*3/uL (ref 0.7–4.0)
MCHC: 32.7 g/dL (ref 30.0–36.0)
MCV: 82.6 fl (ref 78.0–100.0)
Monocytes Absolute: 1.5 10*3/uL — ABNORMAL HIGH (ref 0.1–1.0)
Monocytes Relative: 18.9 % — ABNORMAL HIGH (ref 3.0–12.0)
Neutro Abs: 4.1 10*3/uL (ref 1.4–7.7)
Neutrophils Relative %: 51.8 % (ref 43.0–77.0)
Platelets: 257 10*3/uL (ref 150.0–400.0)
RBC: 5.39 Mil/uL (ref 4.22–5.81)
RDW: 14.8 % (ref 11.5–15.5)
WBC: 7.9 10*3/uL (ref 4.0–10.5)

## 2021-11-19 LAB — LIPID PANEL
Cholesterol: 140 mg/dL (ref 0–200)
HDL: 40.6 mg/dL (ref >39.00)
LDL Cholesterol: 74 mg/dL (ref 0–99)
NonHDL: 99.42
Total CHOL/HDL Ratio: 3
Triglycerides: 127 mg/dL (ref 0.0–149.0)
VLDL: 25.4 mg/dL (ref 0.0–40.0)

## 2021-11-19 LAB — HEMOGLOBIN A1C: Hgb A1c MFr Bld: 6.6 % — ABNORMAL HIGH (ref 4.6–6.5)

## 2021-11-19 LAB — COMPREHENSIVE METABOLIC PANEL
ALT: 25 U/L (ref 0–53)
AST: 24 U/L (ref 0–37)
Albumin: 4.3 g/dL (ref 3.5–5.2)
Alkaline Phosphatase: 73 U/L (ref 39–117)
BUN: 11 mg/dL (ref 6–23)
CO2: 31 mEq/L (ref 19–32)
Calcium: 9.2 mg/dL (ref 8.4–10.5)
Chloride: 97 mEq/L (ref 96–112)
Creatinine, Ser: 1.14 mg/dL (ref 0.40–1.50)
GFR: 74.51 mL/min (ref >60.00)
Glucose, Bld: 93 mg/dL (ref 70–99)
Potassium: 4.8 mEq/L (ref 3.5–5.1)
Sodium: 134 mEq/L — ABNORMAL LOW (ref 135–145)
Total Bilirubin: 0.4 mg/dL (ref 0.2–1.2)
Total Protein: 7.7 g/dL (ref 6.0–8.3)

## 2021-11-19 LAB — HIV ANTIBODY (ROUTINE TESTING W REFLEX): HIV 1&2 Ab, 4th Generation: NONREACTIVE

## 2021-11-19 LAB — HEPATITIS C ANTIBODY
Hepatitis C Ab: NONREACTIVE
SIGNAL TO CUT-OFF: 0.03 (ref <1.00)

## 2021-11-19 NOTE — Progress Notes (Signed)
Derrick Jenkins 50 y.o.   Chief Complaint  Patient presents with   New Patient (Initial Visit)    Pt states the he is experiencing chills and body aches for 2 days, took covid test, negative. Pt would like a physical    HISTORY OF PRESENT ILLNESS: This is a 50 y.o. male first visit to this office here to establish care with me.  Requesting a physical. Has had flulike symptoms for 2 days.  Mostly chills and body aches.  Tested negative for COVID yesterday. No other associated symptoms. Self general health grade: C or D due to smoking and drug abuse.  HPI   Prior to Admission medications   Medication Sig Start Date End Date Taking? Authorizing Provider  nitroGLYCERIN (NITROSTAT) 0.4 MG SL tablet Place 1 tablet (0.4 mg total) under the tongue every 5 (five) minutes as needed for chest pain (CP or SOB). 04/17/17 08/27/19  Erlene Quan, PA-C    No Known Allergies  Patient Active Problem List   Diagnosis Date Noted   Cocaine abuse ([place]) 04/17/2017   Smoker 04/17/2017   Low TSH level 04/17/2017   Normal coronary arteries 04/17/2017   Abnormal EKG 04/16/2017    No past medical history on file.  Past Surgical History:  Procedure Laterality Date   CARDIAC CATHETERIZATION     ELBOW FRACTURE SURGERY Left 1997   S/P fall   FRACTURE SURGERY     LEFT HEART CATH AND CORONARY ANGIOGRAPHY N/A 04/16/2017   Procedure: Left Heart Cath and Coronary Angiography;  Surgeon: Nelva Bush, MD;  Location: [facility];  Service: Cardiovascular;  Laterality: N/A;    Social History   Socioeconomic History   Marital status: Single    Spouse name: Not on file   Number of children: Not on file   Years of education: Not on file   Highest education level: Not on file  Occupational History   Not on file  Tobacco Use   Smoking status: Every Day    Packs/day: 0.50    Years: 28.00    Pack years: 14.00    Types: Cigarettes   Smokeless tobacco: Never  Vaping Use   Vaping Use: Never  used  Substance and Sexual Activity   Alcohol use: Yes    Alcohol/week: 12.0 standard drinks    Types: 12 Cans of beer per week    Comment: 04/16/2017 "I drink on Friday nights only"   Drug use: No    Types: Cocaine    Comment: 04/16/2017 "tried it 04/13/2017 for the 1st time; never again"   Sexual activity: Not Currently    Partners: Male  Other Topics Concern   Not on file  Social History Narrative   Not on file   Social Determinants of Health   Financial Resource Strain: Not on file  Food Insecurity: Not on file  Transportation Needs: Not on file  Physical Activity: Not on file  Stress: Not on file  Social Connections: Not on file  Intimate Partner Violence: Not on file    Family History  Problem Relation Age of Onset   Lupus Mother    Kidney failure Mother      Review of Systems  Constitutional:  Positive for chills. Negative for fever and malaise/fatigue.  HENT: Negative.  Negative for congestion and sore throat.   Respiratory: Negative.  Negative for cough and shortness of breath.   Cardiovascular: Negative.  Negative for chest pain and palpitations.  Gastrointestinal: Negative.  Negative for abdominal  pain, diarrhea, nausea and vomiting.  Genitourinary: Negative.  Negative for dysuria and hematuria.  Skin: Negative.  Negative for rash.  Neurological: Negative.  Negative for dizziness and headaches.  All other systems reviewed and are negative.  Today's Vitals   11/19/21 1007  BP: 120/68  Pulse: 94  Temp: 99.8 F (37.7 C)  TempSrc: Oral  SpO2: 94%  Weight: 153 lb (69.4 kg)  Height: 5\' 7"  (1.702 m)   Body mass index is 23.96 kg/m.  Physical Exam Vitals reviewed.  Constitutional:      Appearance: Normal appearance.  HENT:     Head: Normocephalic.     Right Ear: Tympanic membrane, ear canal and external ear normal.     Left Ear: Tympanic membrane, ear canal and external ear normal.     Mouth/Throat:     Mouth: Mucous membranes are moist.      Pharynx: Oropharynx is clear.  Eyes:     Extraocular Movements: Extraocular movements intact.     Conjunctiva/sclera: Conjunctivae normal.     Pupils: Pupils are equal, round, and reactive to light.  Cardiovascular:     Rate and Rhythm: Normal rate and regular rhythm.     Pulses: Normal pulses.     Heart sounds: Normal heart sounds.  Pulmonary:     Effort: Pulmonary effort is normal.     Breath sounds: Normal breath sounds.  Abdominal:     General: Bowel sounds are normal. There is no distension.     Palpations: Abdomen is soft. There is no mass.     Tenderness: There is no abdominal tenderness.  Musculoskeletal:        General: Normal range of motion.     Cervical back: Normal range of motion and neck supple.     Right lower leg: No edema.     Left lower leg: No edema.  Skin:    General: Skin is warm and dry.     Capillary Refill: Capillary refill takes less than 2 seconds.  Neurological:     General: No focal deficit present.     Mental Status: He is alert and oriented to person, place, and time.  Psychiatric:        Mood and Affect: Mood normal.        Behavior: Behavior normal.     ASSESSMENT & PLAN: Problem List Items Addressed This Visit   None Visit Diagnoses     Routine general medical examination at a health care facility    -  Primary   Need for influenza vaccination       Screening for HIV (human immunodeficiency virus)       Relevant Orders   HIV antibody   Need for hepatitis C screening test       Relevant Orders   Hepatitis C antibody screen   Colon cancer screening       Relevant Orders   Ambulatory referral to Gastroenterology   Screening for deficiency anemia       Relevant Orders   CBC with Differential   Screening for lipoid disorders       Relevant Orders   Lipid panel   Screening for endocrine, metabolic and immunity disorder       Relevant Orders   Comprehensive metabolic panel   Hemoglobin A1c      Modifiable risk factors discussed  with patient. Anticipatory guidance according to age provided. The following topics were also discussed: Social Determinants of Health Smoking.  Cardiovascular and cancer risks  associated with smoking discussed.  Smoking cessation advice given. Drug abuse and need to stop discussed. Diet and nutrition Benefits of exercise Cancer screening and need for colon cancer screening with colonoscopy Vaccinations recommendations Diagnosis of current viral illness of recent onset Cardiovascular risk assessment and review of cardiac cath report from 2018 Mental health including depression and anxiety Fall and accident prevention  Patient Instructions  Health Maintenance, Male Adopting a healthy lifestyle and getting preventive care are important in promoting health and wellness. Ask your health care provider about: The right schedule for you to have regular tests and exams. Things you can do on your own to prevent diseases and keep yourself healthy. What should I know about diet, weight, and exercise? Eat a healthy diet  Eat a diet that includes plenty of vegetables, fruits, low-fat dairy products, and lean protein. Do not eat a lot of foods that are high in solid fats, added sugars, or sodium. Maintain a healthy weight Body mass index (BMI) is a measurement that can be used to identify possible weight problems. It estimates body fat based on height and weight. Your health care provider can help determine your BMI and help you achieve or maintain a healthy weight. Get regular exercise Get regular exercise. This is one of the most important things you can do for your health. Most adults should: Exercise for at least 150 minutes each week. The exercise should increase your heart rate and make you sweat (moderate-intensity exercise). Do strengthening exercises at least twice a week. This is in addition to the moderate-intensity exercise. Spend less time sitting. Even light physical activity can be  beneficial. Watch cholesterol and blood lipids Have your blood tested for lipids and cholesterol at 51 years of age, then have this test every 5 years. You may need to have your cholesterol levels checked more often if: Your lipid or cholesterol levels are high. You are older than 51 years of age. You are at high risk for heart disease. What should I know about cancer screening? Many types of cancers can be detected early and may often be prevented. Depending on your health history and family history, you may need to have cancer screening at various ages. This may include screening for: Colorectal cancer. Prostate cancer. Skin cancer. Lung cancer. What should I know about heart disease, diabetes, and high blood pressure? Blood pressure and heart disease High blood pressure causes heart disease and increases the risk of stroke. This is more likely to develop in people who have high blood pressure readings or are overweight. Talk with your health care provider about your target blood pressure readings. Have your blood pressure checked: Every 3-5 years if you are 71-37 years of age. Every year if you are 44 years old or older. If you are between the ages of 43 and 59 and are a current or former smoker, ask your health care provider if you should have a one-time screening for abdominal aortic aneurysm (AAA). Diabetes Have regular diabetes screenings. This checks your fasting blood sugar level. Have the screening done: Once every three years after age 15 if you are at a normal weight and have a low risk for diabetes. More often and at a younger age if you are overweight or have a high risk for diabetes. What should I know about preventing infection? Hepatitis B If you have a higher risk for hepatitis B, you should be screened for this virus. Talk with your health care provider to find out if you  are at risk for hepatitis B infection. Hepatitis C Blood testing is recommended for: Everyone  born from [birth year] through 1966. Anyone with known risk factors for hepatitis C. Sexually transmitted infections (STIs) You should be screened each year for STIs, including gonorrhea and chlamydia, if: You are sexually active and are younger than 51 years of age. You are older than 51 years of age and your health care provider tells you that you are at risk for this type of infection. Your sexual activity has changed since you were last screened, and you are at increased risk for chlamydia or gonorrhea. Ask your health care provider if you are at risk. Ask your health care provider about whether you are at high risk for HIV. Your health care provider may recommend a prescription medicine to help prevent HIV infection. If you choose to take medicine to prevent HIV, you should first get tested for HIV. You should then be tested every 3 months for as long as you are taking the medicine. Follow these instructions at home: Alcohol use Do not drink alcohol if your health care provider tells you not to drink. If you drink alcohol: Limit how much you have to 0-2 drinks a day. Know how much alcohol is in your drink. In the [place] one drink equals one 12 oz bottle of beer (355 mL), one 5 oz glass of wine (148 mL), or one 1 oz glass of hard liquor (44 mL). Lifestyle Do not use any products that contain nicotine or tobacco. These products include cigarettes, chewing tobacco, and vaping devices, such as e-cigarettes. If you need help quitting, ask your health care provider. Do not use street drugs. Do not share needles. Ask your health care provider for help if you need support or information about quitting drugs. General instructions Schedule regular health, dental, and eye exams. Stay current with your vaccines. Tell your health care provider if: You often feel depressed. You have ever been abused or do not feel safe at home. Summary Adopting a healthy lifestyle and getting preventive care are important  in promoting health and wellness. Follow your health care provider's instructions about healthy diet, exercising, and getting tested or screened for diseases. Follow your health care provider's instructions on monitoring your cholesterol and blood pressure. This information is not intended to replace advice given to you by your health care provider. Make sure you discuss any questions you have with your health care provider. Document Revised: 04/08/2021 Document Reviewed: 04/08/2021 Elsevier Patient Education  2022 [place], [place] [facility]

## 2021-11-19 NOTE — Patient Instructions (Signed)

## 2021-11-20 ENCOUNTER — Other Ambulatory Visit: Payer: Self-pay

## 2021-11-20 ENCOUNTER — Ambulatory Visit (HOSPITAL_COMMUNITY)
Admission: EM | Admit: 2021-11-20 | Discharge: 2021-11-20 | Disposition: A | Discharge status: Home / Self Care | Payer: BC Managed Care – PPO | Attending: Family Medicine | Admitting: Family Medicine

## 2021-11-20 ENCOUNTER — Encounter (HOSPITAL_COMMUNITY): Payer: Self-pay | Admitting: Emergency Medicine

## 2021-11-20 DIAGNOSIS — J101 Influenza due to other identified influenza virus with other respiratory manifestations: Secondary | ICD-10-CM | POA: Insufficient documentation

## 2021-11-20 LAB — POC INFLUENZA A AND B ANTIGEN (URGENT CARE ONLY)
INFLUENZA A ANTIGEN, POC: POSITIVE — AB
INFLUENZA B ANTIGEN, POC: NEGATIVE

## 2021-11-20 LAB — POCT RAPID STREP A, ED / UC: Streptococcus, Group A Screen (Direct): NEGATIVE

## 2021-11-20 MED ORDER — PROMETHAZINE-DM 6.25-15 MG/5ML PO SYRP: 5.0000 mL | ORAL_SOLUTION | Freq: Four times a day (QID) | ORAL | 0 refills | Status: DC | PRN

## 2021-11-20 MED ORDER — OSELTAMIVIR PHOSPHATE 75 MG PO CAPS: 75.0000 mg | ORAL_CAPSULE | Freq: Two times a day (BID) | ORAL | 0 refills | Status: AC

## 2021-11-20 NOTE — ED Triage Notes (Signed)
Patient c/o chills, nonproductive cough, and generalized body aches x 3 days.   Patient endorse sore throat upon onset of symptoms.   Patient has taken a COVID test at [facility] with a negative result.   Patient has taken Alka seltzer and Nyquil with some relief of symptoms.

## 2021-11-20 NOTE — ED Provider Notes (Signed)
[facility]   [id number] 11/20/21 Arrival Time: 1006  ASSESSMENT & PLAN:  1. Influenza A    Discussed typical duration of viral illnesses. Rapid flu positive. Rapid strep negative. Throat culture sent/pending. OTC symptom care as needed.  Discharge Medication List as of 11/20/2021 12:05 PM     START taking these medications   Details  oseltamivir (TAMIFLU) 75 MG capsule Take 1 capsule (75 mg total) by mouth 2 (two) times daily for 5 days., Starting Wed 11/20/2021, Until Mon 11/25/2021, Normal    promethazine-dextromethorphan (PROMETHAZINE-DM) 6.25-15 MG/5ML syrup Take 5 mLs by mouth 4 (four) times daily as needed for cough., Starting Wed 11/20/2021, Normal       Work note provided.   Follow-up Information     Sagardia, Ines Bloomer, MD.   Specialty: Internal Medicine Why: As needed. Contact information: [place] [address] [facility] [place].   Specialty: Urgent Care Why: If worsening or failing to improve as anticipated. Contact information: [place] [id number] [phone]                Reviewed expectations re: course of current medical issues. Questions answered. Outlined signs and symptoms indicating need for more acute intervention. Understanding verbalized. After Visit Summary given.   SUBJECTIVE: History from: patient. Derrick Jenkins is a 50 y.o. male who reports: non-prod cough; body aches; x 3 d; subj fever/chills. ST today. Denies: difficulty breathing. Normal PO intake without n/v/d.  OBJECTIVE:  Vitals:   11/20/21 1122  BP: 121/81  Pulse: 82  Resp: 14  Temp: 98.7 F (37.1 C)  TempSrc: Oral  SpO2: 97%    General appearance: alert; no distress Eyes: PERRLA; EOMI; conjunctiva normal HENT: [place]; AT; with nasal congestion; throat erythematous Neck: supple  Lungs: speaks full sentences without difficulty; unlabored;  clear Extremities: no edema Skin: warm and dry Neurologic: normal gait Psychological: alert and cooperative; normal mood and affect  Labs: Results for orders placed or performed during the hospital encounter of 11/20/21  POCT Rapid Strep A  Result Value Ref Range   Streptococcus, Group A Screen (Direct) NEGATIVE NEGATIVE  POC Influenza A & B Ag (Urgent Care)  Result Value Ref Range   INFLUENZA A ANTIGEN, POC POSITIVE (A) NEGATIVE   INFLUENZA B ANTIGEN, POC NEGATIVE NEGATIVE   Labs Reviewed  POC INFLUENZA A AND B ANTIGEN (URGENT CARE ONLY) - Abnormal; Notable for the following components:      Result Value   INFLUENZA A ANTIGEN, POC POSITIVE (*)    All other components within normal limits  CULTURE, GROUP A STREP [facility])  POCT RAPID STREP A, ED / UC     No Known Allergies  History reviewed. No pertinent past medical history. Social History   Socioeconomic History   Marital status: Single    Spouse name: Not on file   Number of children: Not on file   Years of education: Not on file   Highest education level: Not on file  Occupational History   Not on file  Tobacco Use   Smoking status: Every Day    Packs/day: 0.50    Years: 28.00    Pack years: 14.00    Types: Cigarettes   Smokeless tobacco: Never  Vaping Use   Vaping Use: Never used  Substance and Sexual Activity   Alcohol use: Yes    Alcohol/week: 12.0 standard drinks  Types: 12 Cans of beer per week    Comment: 04/16/2017 "I drink on Friday nights only"   Drug use: No    Types: Cocaine    Comment: 04/16/2017 "tried it 04/13/2017 for the 1st time; never again"   Sexual activity: Not Currently    Partners: Male  Other Topics Concern   Not on file  Social History Narrative   Not on file   Social Determinants of Health   Financial Resource Strain: Not on file  Food Insecurity: Not on file  Transportation Needs: Not on file  Physical Activity: Not on file  Stress: Not on file  Social Connections: Not  on file  Intimate Partner Violence: Not on file   Family History  Problem Relation Age of Onset   Lupus Mother    Kidney failure Mother    Past Surgical History:  Procedure Laterality Date   CARDIAC CATHETERIZATION     ELBOW FRACTURE SURGERY Left 1997   S/P fall   FRACTURE SURGERY     LEFT HEART CATH AND CORONARY ANGIOGRAPHY N/A 04/16/2017   Procedure: Left Heart Cath and Coronary Angiography;  Surgeon: Nelva Bush, MD;  Location: [facility];  Service: Cardiovascular;  Laterality: N/A;     Vanessa Kick, MD 11/20/21 1301

## 2021-11-22 LAB — CULTURE, GROUP A STREP (THRC)

## 2021-12-03 ENCOUNTER — Encounter: Payer: Self-pay | Admitting: Emergency Medicine

## 2022-01-27 ENCOUNTER — Encounter: Payer: Self-pay | Admitting: Gastroenterology

## 2022-02-03 ENCOUNTER — Ambulatory Visit (AMBULATORY_SURGERY_CENTER): Payer: BC Managed Care – PPO

## 2022-02-03 ENCOUNTER — Other Ambulatory Visit: Payer: Self-pay

## 2022-02-03 VITALS — Ht 67.0 in | Wt 160.0 lb

## 2022-02-03 DIAGNOSIS — Z1211 Encounter for screening for malignant neoplasm of colon: Secondary | ICD-10-CM

## 2022-02-03 MED ORDER — NA SULFATE-K SULFATE-MG SULF 17.5-3.13-1.6 GM/177ML PO SOLN: 1.0000 | Freq: Once | ORAL | 0 refills | Status: AC

## 2022-02-03 NOTE — Progress Notes (Signed)
No egg or soy allergy known to patient  ?No issues known to pt with past sedation with any surgeries or procedures ?Patient denies ever being told they had issues or difficulty with intubation  ?No FH of Malignant Hyperthermia ?Pt is not on diet pills ?Pt is not on home 02  ?Pt is not on blood thinners  ?Pt denies issues with constipation;  ?No A fib or A flutter ?Pt is fully vaccinated for Covid x 2; ?NO PA's for preps discussed with pt in PV today  ?Discussed with pt there will be an out-of-pocket cost for prep and that varies from $0 to 70 + dollars - pt verbalized understanding  ?Due to the COVID-19 pandemic we are asking patients to follow certain guidelines in PV and the Cataract   ?Pt aware of COVID protocols and [facility] guidelines  ?PV completed over the phone. Pt verified name, DOB, address and insurance during PV today.  ?Pt mailed instruction packet with copy of consent form to read and not return, and instructions.  ?Pt encouraged to call with questions or issues.  ?If pt has My chart, procedure instructions sent via My Chart  ? ? ?

## 2022-02-19 ENCOUNTER — Encounter: Payer: Self-pay | Admitting: Gastroenterology

## 2022-02-24 ENCOUNTER — Ambulatory Visit (AMBULATORY_SURGERY_CENTER): Payer: BC Managed Care – PPO | Admitting: Gastroenterology

## 2022-02-24 ENCOUNTER — Other Ambulatory Visit: Payer: Self-pay

## 2022-02-24 ENCOUNTER — Encounter: Payer: Self-pay | Admitting: Gastroenterology

## 2022-02-24 VITALS — BP 123/62 | HR 74 | Temp 97.3°F | Resp 15 | Ht 67.0 in | Wt 160.0 lb

## 2022-02-24 DIAGNOSIS — D124 Benign neoplasm of descending colon: Secondary | ICD-10-CM

## 2022-02-24 DIAGNOSIS — Z1211 Encounter for screening for malignant neoplasm of colon: Secondary | ICD-10-CM | POA: Diagnosis not present

## 2022-02-24 DIAGNOSIS — D128 Benign neoplasm of rectum: Secondary | ICD-10-CM | POA: Diagnosis not present

## 2022-02-24 MED ORDER — SODIUM CHLORIDE 0.9 % IV SOLN: 500.0000 mL | Freq: Once | INTRAVENOUS | Status: DC

## 2022-02-24 NOTE — Progress Notes (Signed)
Patient was belligerently disruptive, argumentative and irate in PACU. He took off all leads, BP cuff and pulse ox. Only able to get one set of vital signs in PACU. Proceeded to get dressed against our instructions, and refused to get in wheelchair. Refused to sign AMA form. Patient walked out with care partner.  Dr Candis Schatz aware. ?

## 2022-02-24 NOTE — Progress Notes (Signed)
PT taken to PACU. Monitors in place. VSS. Report given to RN. 

## 2022-02-24 NOTE — Progress Notes (Signed)
[facility] History and Physical ? ? ?Primary Care Physician:  Horald Pollen, MD ? ? ?Reason for Procedure:   Colon cancer screening ? ?Plan:    Screening colonoscopy ? ? ? ? ?HPI: Derrick Jenkins is a 51 y.o. male undergoing initial average risk screening colonoscopy.  He has no family history of colon cancer and no chronic GI symptoms.  ? ? ?History reviewed. No pertinent past medical history. ? ?Past Surgical History:  ?Procedure Laterality Date  ? CARDIAC CATHETERIZATION    ? ELBOW FRACTURE SURGERY Left 1997  ? S/P fall  ? LEFT HEART CATH AND CORONARY ANGIOGRAPHY N/A 04/16/2017  ? Procedure: Left Heart Cath and Coronary Angiography;  Surgeon: Nelva Bush, MD;  Location: [facility];  Service: Cardiovascular;  Laterality: N/A;  ? ? ?Prior to Admission medications   ?Medication Sig Start Date End Date Taking? Authorizing Provider  ?nitroGLYCERIN (NITROSTAT) 0.4 MG SL tablet Place 1 tablet (0.4 mg total) under the tongue every 5 (five) minutes as needed for chest pain (CP or SOB). 04/17/17 08/27/19  Erlene Quan, PA-C  ? ? ?No current outpatient medications on file.  ? ?Current Facility-Administered Medications  ?Medication Dose Route Frequency Provider Last Rate Last Admin  ? 0.9 %  sodium chloride infusion  500 mL Intravenous Once Daryel November, MD      ? ? ?Allergies as of 02/24/2022  ? (No Known Allergies)  ? ? ?Family History  ?Problem Relation Age of Onset  ? Lupus Mother   ? Kidney failure Mother   ? Colon cancer Neg Hx   ? Colon polyps Neg Hx   ? Esophageal cancer Neg Hx   ? Rectal cancer Neg Hx   ? Stomach cancer Neg Hx   ? ? ?Social History  ? ?Socioeconomic History  ? Marital status: Single  ?  Spouse name: Not on file  ? Number of children: Not on file  ? Years of education: Not on file  ? Highest education level: Not on file  ?Occupational History  ? Not on file  ?Tobacco Use  ? Smoking status: Every Day  ?  Packs/day: 0.50  ?  Years: 28.00  ?  Pack years: 14.00   ?  Types: Cigarettes  ? Smokeless tobacco: Never  ?Vaping Use  ? Vaping Use: Never used  ?Substance and Sexual Activity  ? Alcohol use: Yes  ?  Alcohol/week: 12.0 standard drinks  ?  Types: 12 Cans of beer per week  ?  Comment: "I drink on Friday nights only"  ? Drug use: Yes  ?  Frequency: 2.0 times per week  ?  Types: Marijuana  ? Sexual activity: Not Currently  ?  Partners: Male  ?Other Topics Concern  ? Not on file  ?Social History Narrative  ? Not on file  ? ?Social Determinants of Health  ? ?Financial Resource Strain: Not on file  ?Food Insecurity: Not on file  ?Transportation Needs: Not on file  ?Physical Activity: Not on file  ?Stress: Not on file  ?Social Connections: Not on file  ?Intimate Partner Violence: Not on file  ? ? ?Review of Systems: ? ?All other review of systems negative except as mentioned in the HPI. ? ?Physical Exam: ?Vital signs ?BP 116/72   Pulse 68   Temp (!) 97.3 ?F (36.3 ?C) (Temporal)   Ht '5\' 7"'$  (1.702 m)   Wt 160 lb (72.6 kg)   SpO2 100%   BMI 25.06 kg/m?  ? ?General:  Alert,  Well-developed, well-nourished, pleasant and cooperative in NAD ?Airway:  Mallampati 2 ?Lungs:  Clear throughout to auscultation.   ?Heart:  Regular rate and rhythm; no murmurs, clicks, rubs,  or gallops. ?Abdomen:  Soft, nontender and nondistended. Normal bowel sounds.   ?Neuro/Psych:  Normal mood and affect. A and O x 3 ? ? ?Laderius Valbuena E. Candis Schatz, MD ?[facility] ? ?

## 2022-02-24 NOTE — Patient Instructions (Signed)
Handout on polyps given. ? ? ?OU HAD AN ENDOSCOPIC PROCEDURE TODAY AT [facility]:   Refer to the procedure report that was given to you for any specific questions about what was found during the examination.  If the procedure report does not answer your questions, please call your gastroenterologist to clarify.  If you requested that your care partner not be given the details of your procedure findings, then the procedure report has been included in a sealed envelope for you to review at your convenience later. ? ?YOU SHOULD EXPECT: Some feelings of bloating in the abdomen. Passage of more gas than usual.  Walking can help get rid of the air that was put into your GI tract during the procedure and reduce the bloating. If you had a lower endoscopy (such as a colonoscopy or flexible sigmoidoscopy) you may notice spotting of blood in your stool or on the toilet paper. If you underwent a bowel prep for your procedure, you may not have a normal bowel movement for a few days. ? ?Please Note:  You might notice some irritation and congestion in your nose or some drainage.  This is from the oxygen used during your procedure.  There is no need for concern and it should clear up in a day or so. ? ?SYMPTOMS TO REPORT IMMEDIATELY: ? ?Following lower endoscopy (colonoscopy or flexible sigmoidoscopy): ? Excessive amounts of blood in the stool ? Significant tenderness or worsening of abdominal pains ? Swelling of the abdomen that is new, acute ? Fever of 100?F or higher ? ?For urgent or emergent issues, a gastroenterologist can be reached at any hour by calling [phone]. ?Do not use MyChart messaging for urgent concerns.  ? ? ?DIET:  We do recommend a small meal at first, but then you may proceed to your regular diet.  Drink plenty of fluids but you should avoid alcoholic beverages for 24 hours. ? ?ACTIVITY:  You should plan to take it easy for the rest of today and you should NOT DRIVE or use heavy  machinery until tomorrow (because of the sedation medicines used during the test).   ? ?FOLLOW UP: ?Our staff will call the number listed on your records 48-72 hours following your procedure to check on you and address any questions or concerns that you may have regarding the information given to you following your procedure. If we do not reach you, we will leave a message.  We will attempt to reach you two times.  During this call, we will ask if you have developed any symptoms of COVID 19. If you develop any symptoms (ie: fever, flu-like symptoms, shortness of breath, cough etc.) before then, please call [phone].  If you test positive for Covid 19 in the 2 weeks post procedure, please call and report this information to [place].   ? ?If any biopsies were taken you will be contacted by phone or by letter within the next 1-3 weeks.  Please call us at [phone] if you have not heard about the biopsies in 3 weeks.  ? ? ?SIGNATURES/CONFIDENTIALITY: ?You and/or your care partner have signed paperwork which will be entered into your electronic medical record.  These signatures attest to the fact that that the information above on your After Visit Summary has been reviewed and is understood.  Full responsibility of the confidentiality of this discharge information lies with you and/or your care-partner.  ?

## 2022-02-24 NOTE — Op Note (Signed)
[facility] ?Patient Name: Derrick Jenkins ?Procedure Date: 02/24/2022 3:59 PM ?MRN: [id number] ?Endoscopist: Gareth Fitzner E. Candis Schatz , MD ?Age: 51 ?Referring MD:  ?Date of Birth: 04/22/1971 ?Gender: Male ?Account #: [id number] ?Procedure:                Colonoscopy ?Indications:              Screening for colorectal malignant neoplasm, This  ?                          is the patient's first colonoscopy ?Medicines:                Monitored Anesthesia Care ?Procedure:                Pre-Anesthesia Assessment: ?                          - Prior to the procedure, a History and Physical  ?                          was performed, and patient medications and  ?                          allergies were reviewed. The patient's tolerance of  ?                          previous anesthesia was also reviewed. The risks  ?                          and benefits of the procedure and the sedation  ?                          options and risks were discussed with the patient.  ?                          All questions were answered, and informed consent  ?                          was obtained. Prior Anticoagulants: The patient has  ?                          taken no previous anticoagulant or antiplatelet  ?                          agents. ASA Grade Assessment: II - A patient with  ?                          mild systemic disease. After reviewing the risks  ?                          and benefits, the patient was deemed in  ?                          satisfactory condition to undergo the procedure. ?  After obtaining informed consent, the colonoscope  ?                          was passed under direct vision. Throughout the  ?                          procedure, the patient's blood pressure, pulse, and  ?                          oxygen saturations were monitored continuously. The  ?                          CF [id number] #[id number] was introduced through the anus  ?                          and advanced to the  the terminal ileum, with  ?                          identification of the appendiceal orifice and IC  ?                          valve. The colonoscopy was somewhat difficult due  ?                          to poor endoscopic visualization and a tortuous  ?                          colon. Successful completion of the procedure was  ?                          aided by using manual pressure. The patient  ?                          tolerated the procedure well. The quality of the  ?                          bowel preparation was adequate. The terminal ileum,  ?                          ileocecal valve, appendiceal orifice, and rectum  ?                          were photographed. The bowel preparation used was  ?                          SUPREP via split dose instruction. ?Scope In: 4:09:07 PM ?Scope Out: 4:34:12 PM ?Scope Withdrawal Time: 0 hours 14 minutes 39 seconds  ?Total Procedure Duration: 0 hours 25 minutes 5 seconds  ?Findings:                 The perianal and digital rectal examinations were  ?                          normal. Pertinent negatives include normal  ?  sphincter tone and no palpable rectal lesions. ?                          Two sessile polyps were found in the rectum. The  ?                          polyps were 4 to 5 mm in size. These polyps were  ?                          removed with a cold snare. Resection and retrieval  ?                          were complete. Estimated blood loss was minimal. ?                          A 3 mm polyp was found in the descending colon. The  ?                          polyp was sessile. The polyp was removed with a  ?                          cold snare. Resection and retrieval were complete.  ?                          Estimated blood loss was minimal. ?                          The exam was otherwise normal throughout the  ?                          examined colon. ?                          The terminal ileum appeared normal. ?                           The retroflexed view of the distal rectum and anal  ?                          verge was normal and showed no anal or rectal  ?                          abnormalities. ?Complications:            No immediate complications. ?Estimated Blood Loss:     Estimated blood loss was minimal. ?Impression:               - Two 4 to 5 mm polyps in the rectum, removed with  ?                          a cold snare. Resected and retrieved. ?                          - One 3 mm polyp in  the descending colon, removed  ?                          with a cold snare. Resected and retrieved. ?                          - The examined portion of the ileum was normal. ?                          - The distal rectum and anal verge are normal on  ?                          retroflexion view. ?Recommendation:           - Patient has a contact number available for  ?                          emergencies. The signs and symptoms of potential  ?                          delayed complications were discussed with the  ?                          patient. Return to normal activities tomorrow.  ?                          Written discharge instructions were provided to the  ?                          patient. ?                          - Resume previous diet. ?                          - Continue present medications. ?                          - Await pathology results. ?                          - Repeat colonoscopy (date not yet determined) for  ?                          surveillance based on pathology results. ?Connor Meacham E. Candis Schatz, MD ?02/24/2022 4:38:45 PM ?This report has been signed electronically. ?

## 2022-02-24 NOTE — Progress Notes (Signed)
Called to room to assist during endoscopic procedure.  Patient ID and intended procedure confirmed with present staff. Received instructions for my participation in the procedure from the performing physician.  

## 2022-02-26 ENCOUNTER — Telehealth: Payer: Self-pay

## 2022-02-26 NOTE — Telephone Encounter (Signed)
?  Follow up Call- ? ? ?  02/24/2022  ?  2:39 PM  ?Call back number  ?Post procedure Call Back phone  # [phone]  ?Permission to leave phone message Yes  ?  ? ?Patient questions: ? ?Do you have a fever, pain , or abdominal swelling? No. ?Pain Score  0 * ? ?Have you tolerated food without any problems? Yes.   ? ?Have you been able to return to your normal activities? Yes.   ? ?Do you have any questions about your discharge instructions: ?Diet   No. ?Medications  No. ?Follow up visit  No. ? ?Do you have questions or concerns about your Care? No. ? ?Actions: ?* If pain score is 4 or above: ?No action needed, pain <4. ? ? ?

## 2022-02-26 NOTE — Telephone Encounter (Signed)
Follow up call placed, VM obtained and message left. ?SChaplin, RN,BSN ? ?

## 2022-03-03 ENCOUNTER — Encounter: Payer: Self-pay | Admitting: Gastroenterology

## 2022-03-12 ENCOUNTER — Encounter: Payer: Self-pay | Admitting: Gastroenterology

## 2022-06-26 IMAGING — DX DG SHOULDER 2+V*L*
4 series · 4 of 4 positions shown · non-contrast
Comparison: None

CLINICAL DATA: LEFT shoulder pain, posterior LEFT chest pain
radiating to anterior chest, pain with raising LEFT arm

EXAM:
LEFT SHOULDER - 2+ VIEW

[shoulder grashey]
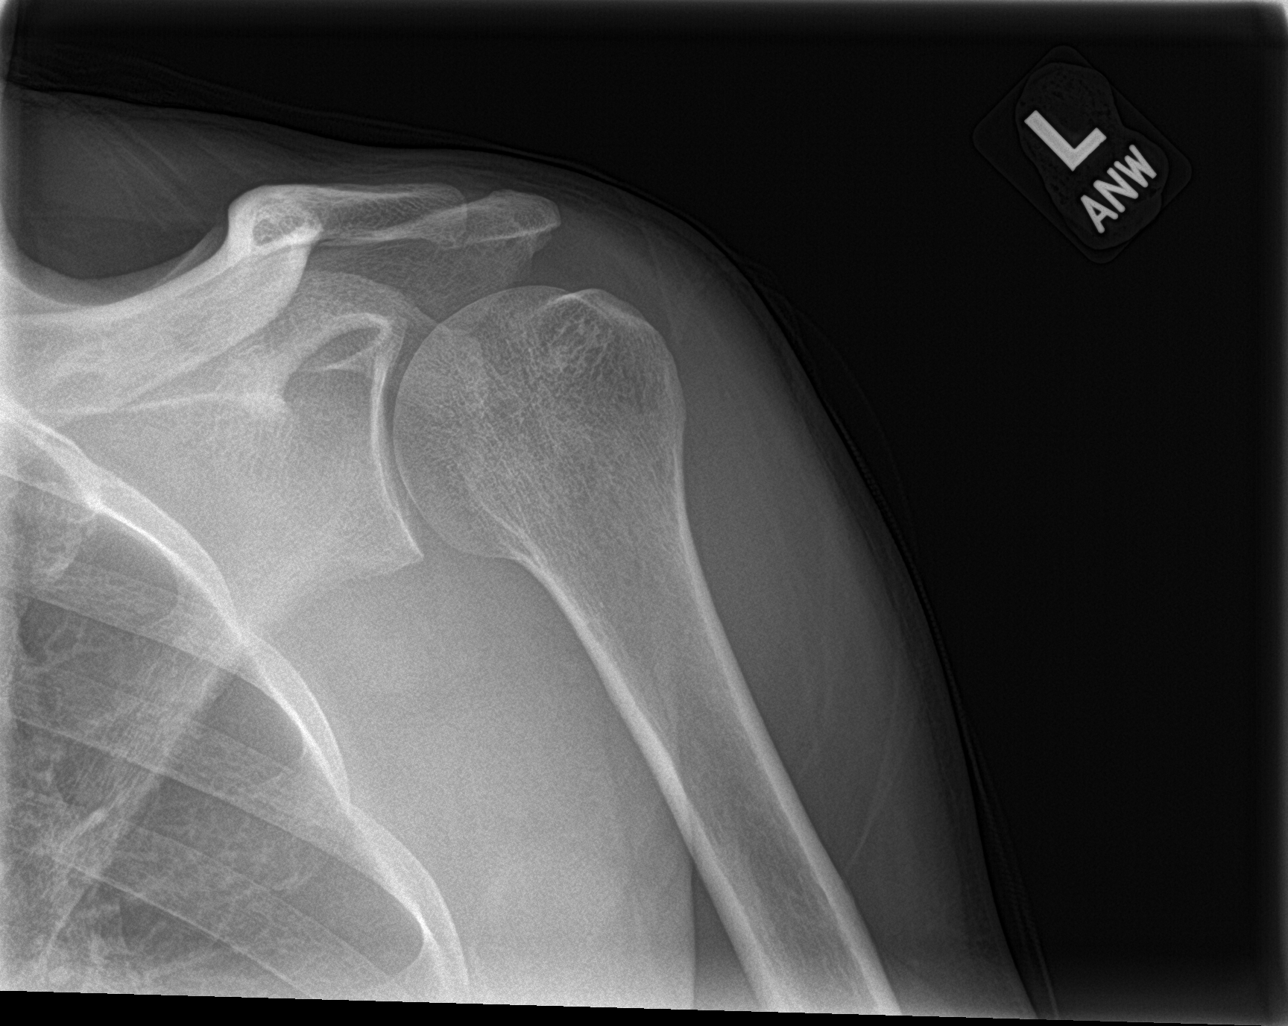

[shoulder y view (1 of 2)]
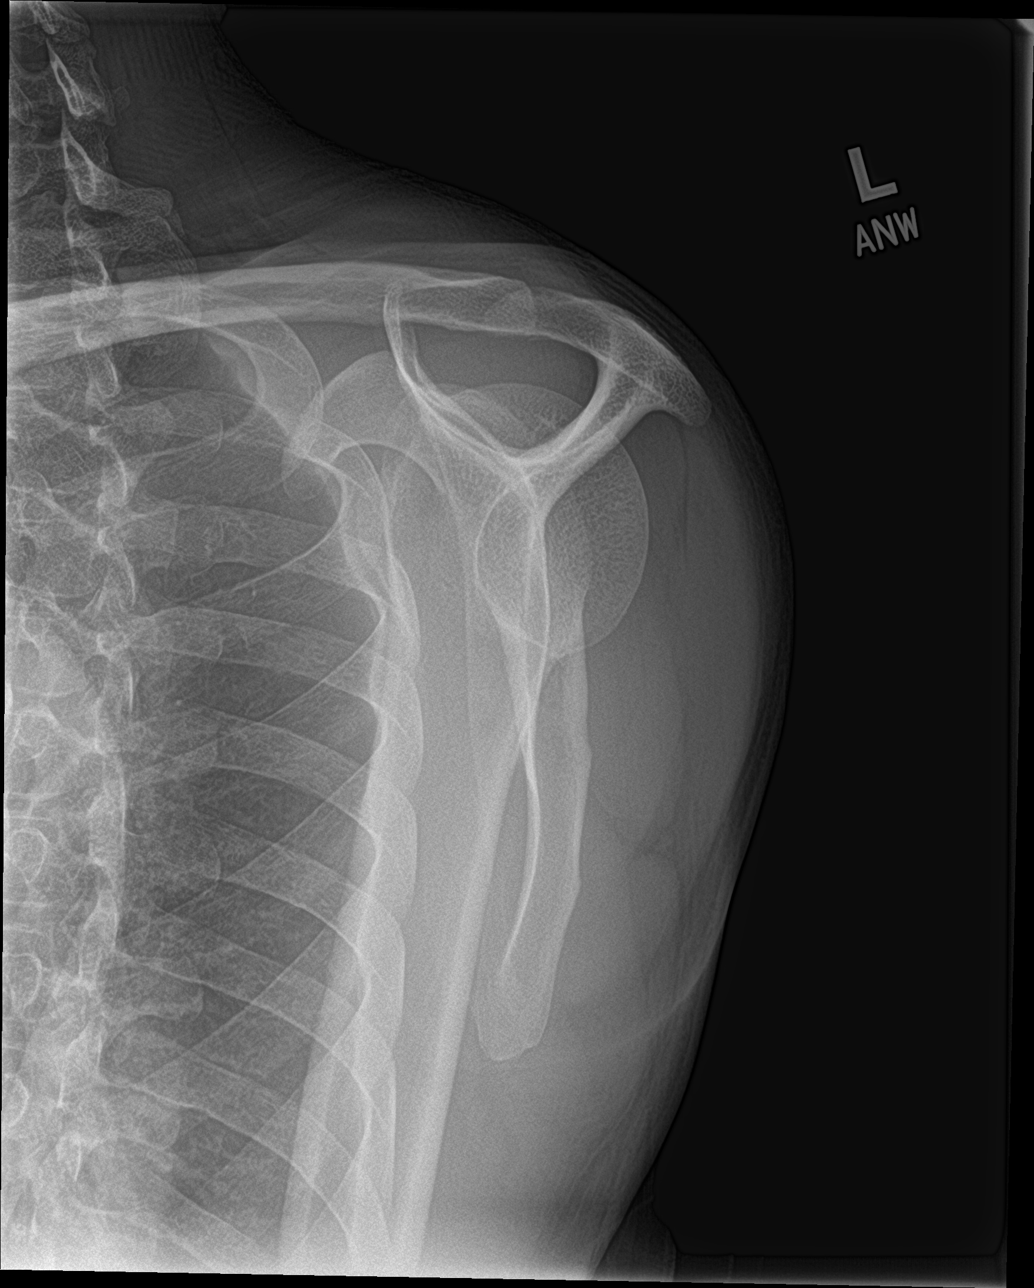

[shoulder axillary]
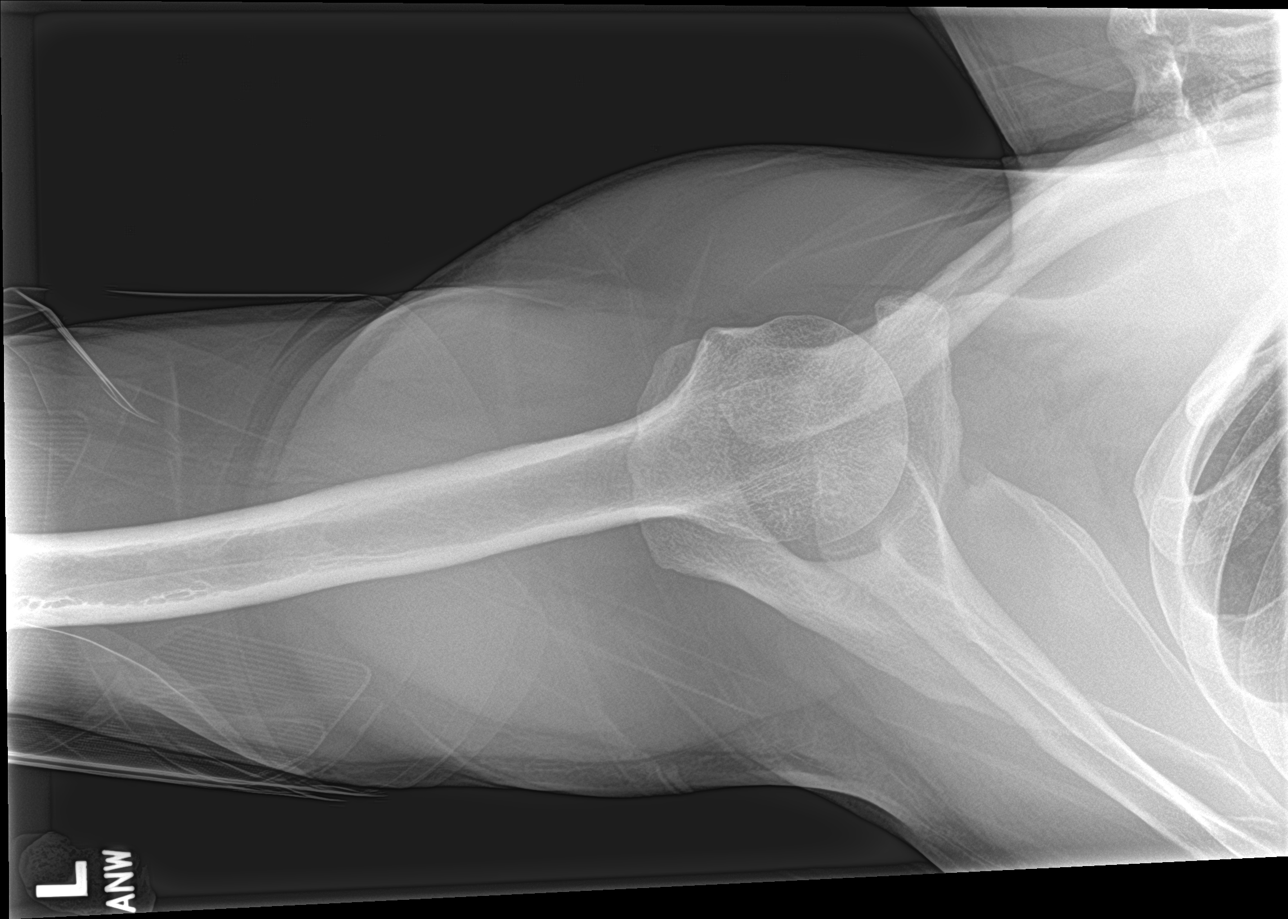

[shoulder y view (2 of 2)]
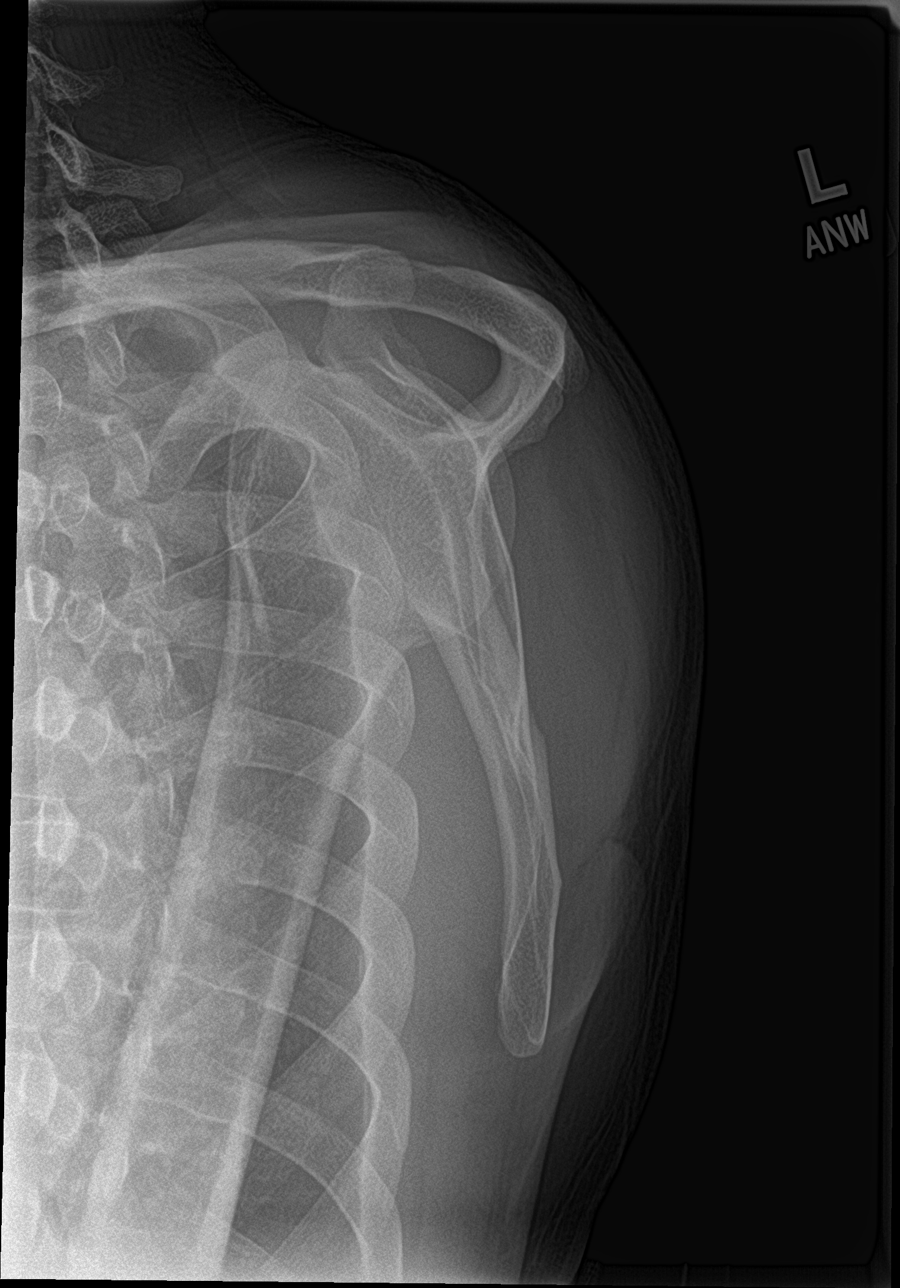

[4 of 4 positions shown; findings below may reference images not displayed]

FINDINGS: Osseous mineralization normal.

AC joint alignment normal.

No acute fracture, dislocation or bone destruction.

Visualized ribs unremarkable.
IMPRESSION: Normal exam.

## 2022-06-26 IMAGING — DX DG CHEST 2V
2 series · 2 of 2 positions shown · non-contrast
Comparison: 04/16/2017

CLINICAL DATA: LEFT shoulder pain, posterior LEFT chest pain
radiating to anterior chest since yesterday, pain worse with raising
LEFT arm, no known injury

EXAM:
CHEST - 2 VIEW

[chest pa]
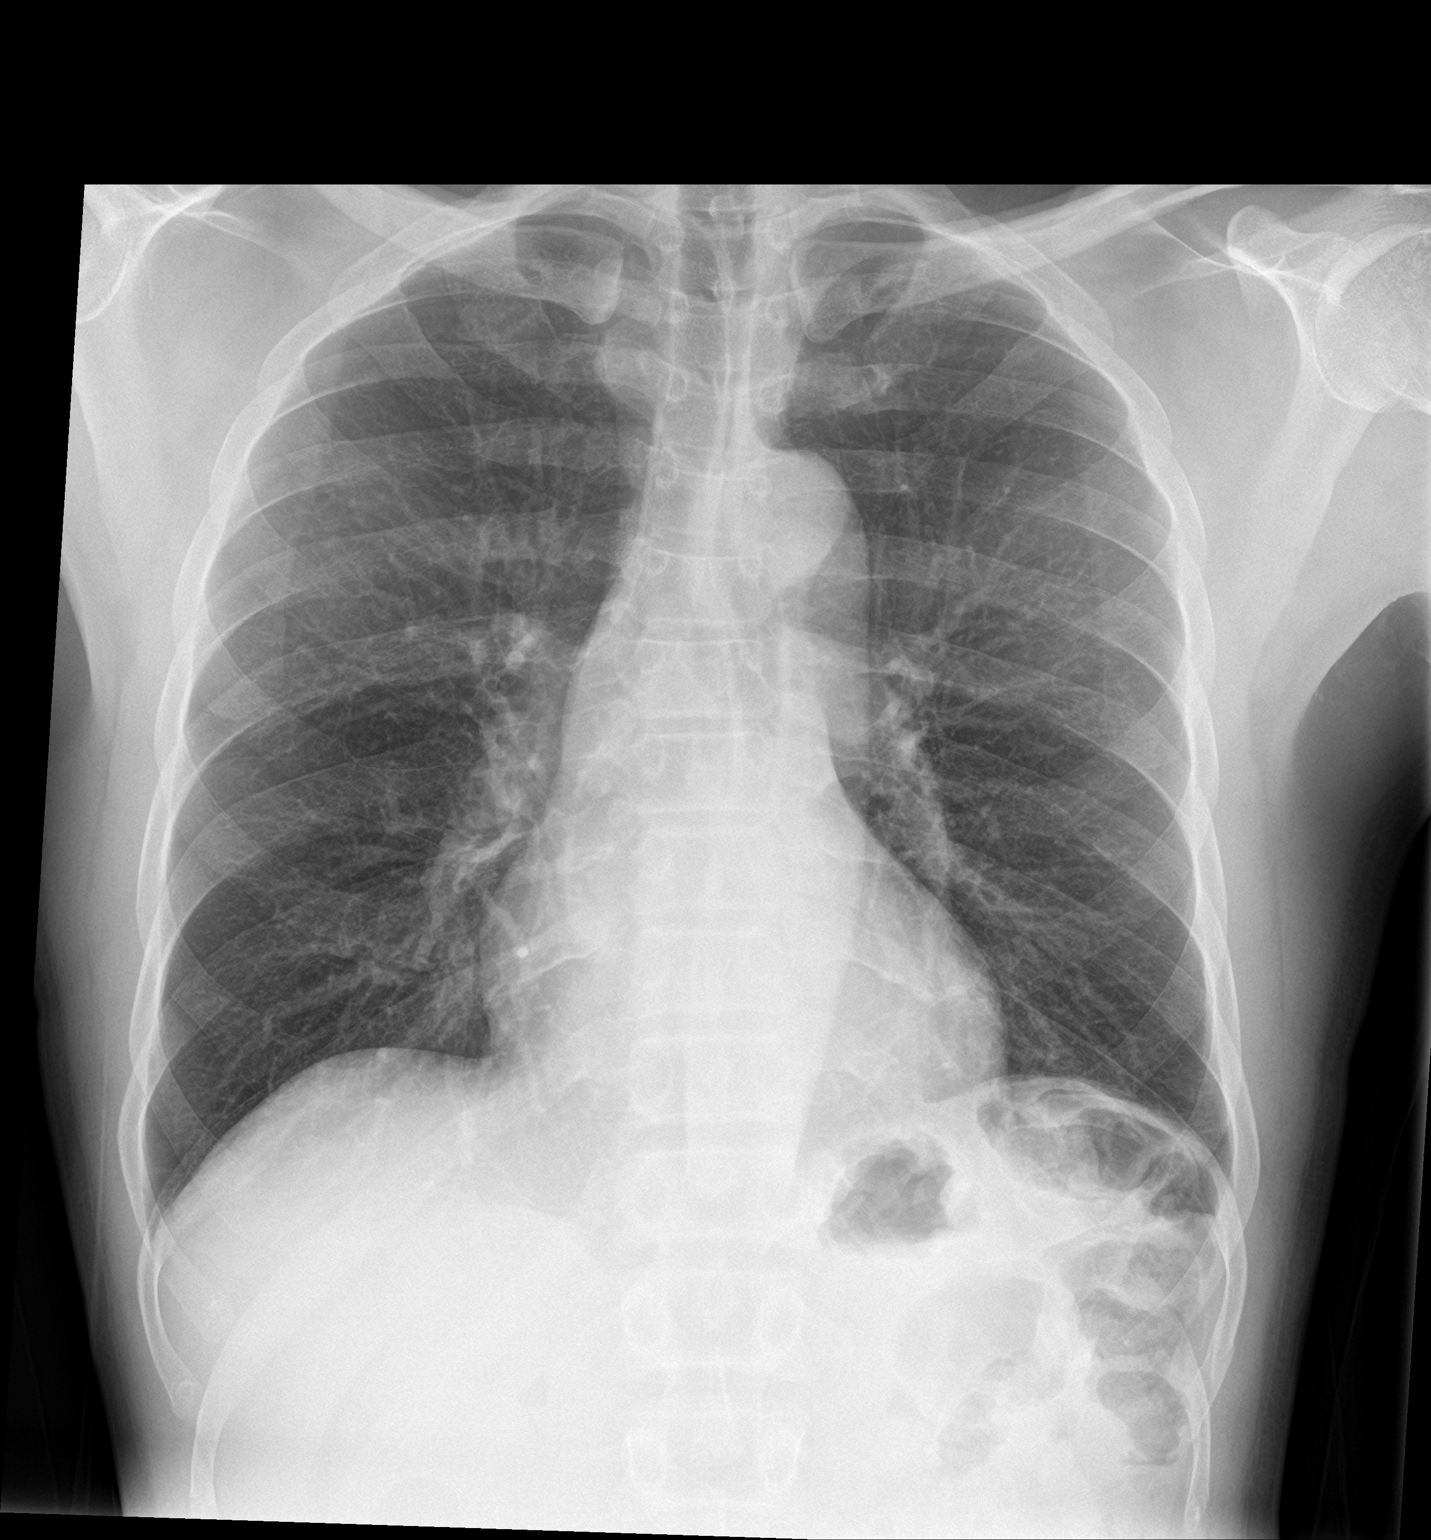

[chest lat]
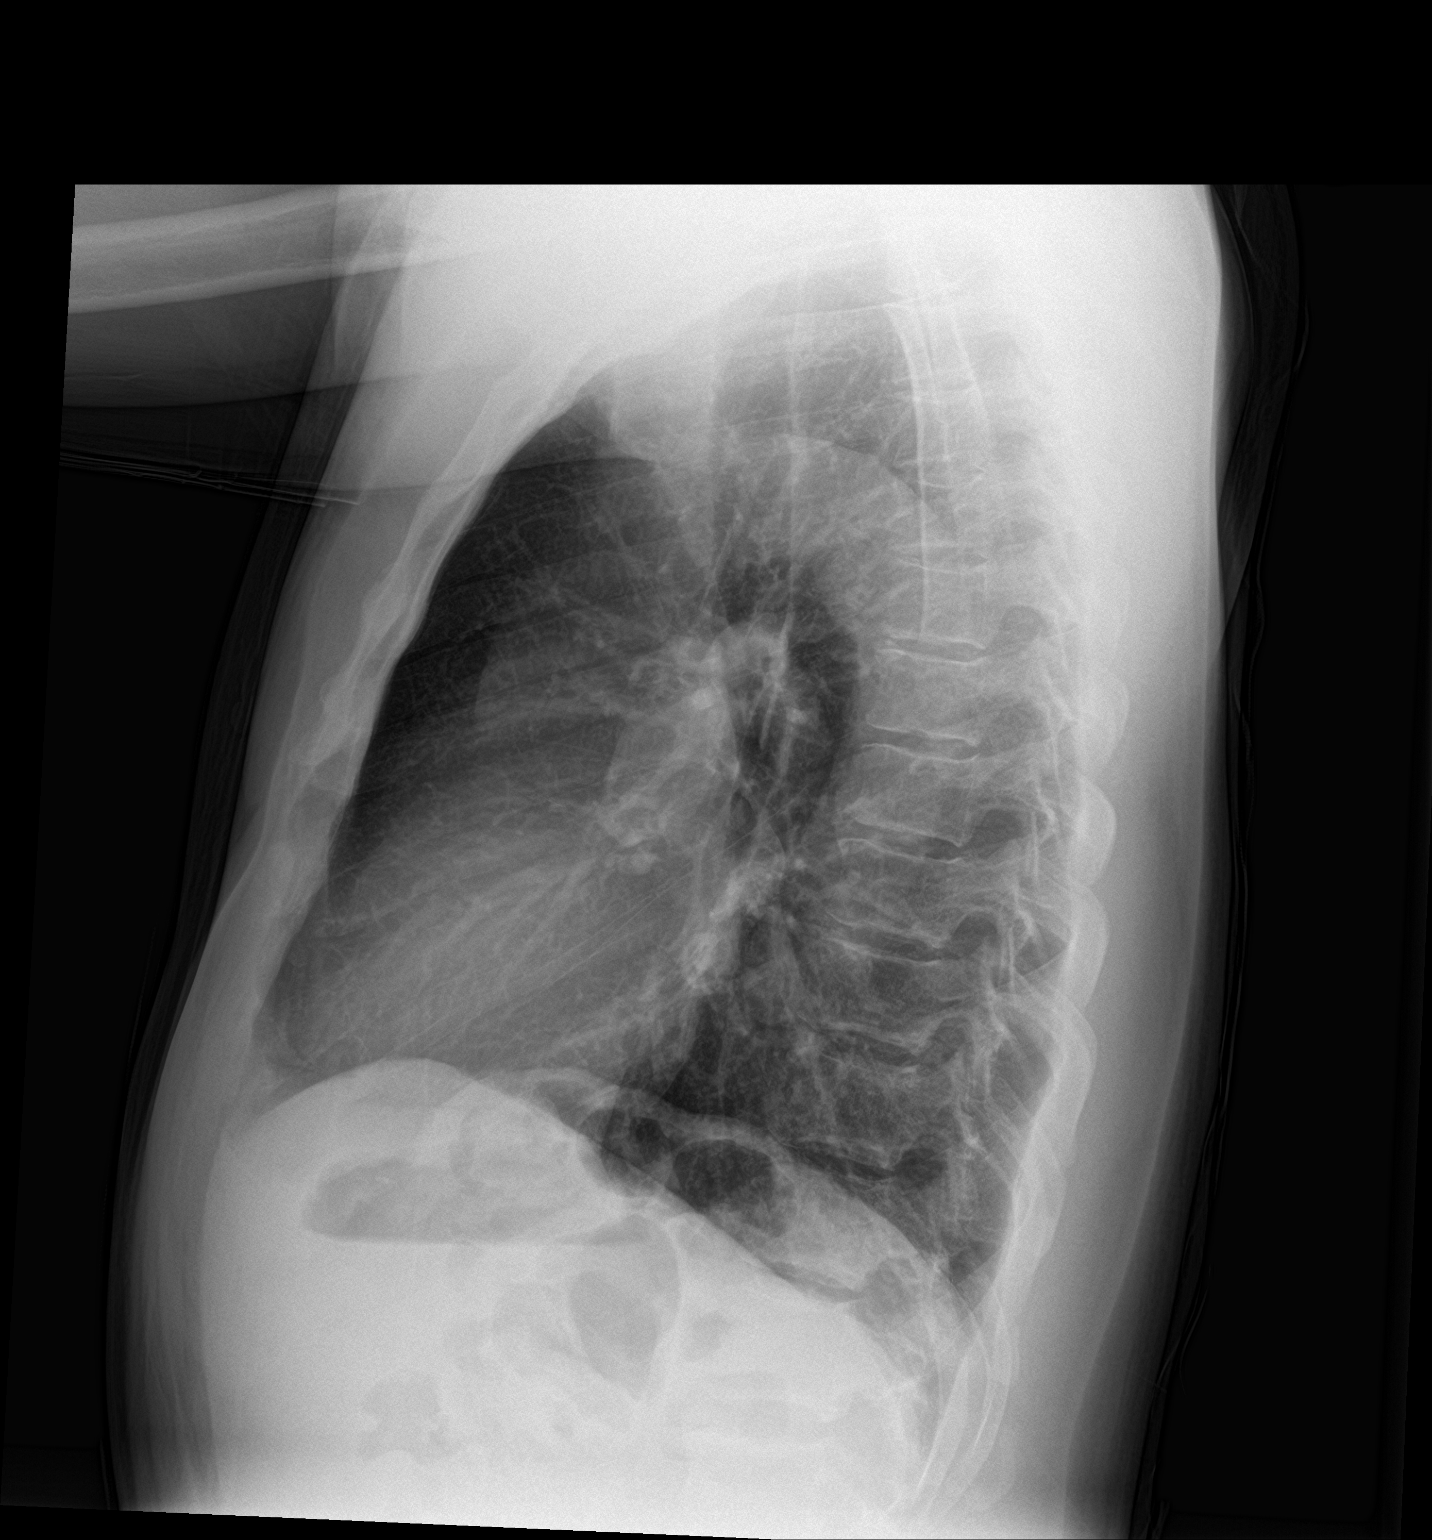

[2 of 2 positions shown; findings below may reference images not displayed]

FINDINGS: Normal heart size, mediastinal contours, and pulmonary vascularity.

Central peribronchial thickening, mild.

Lungs clear.

No acute infiltrate, pleural effusion, or pneumothorax.

Osseous structures unremarkable.
IMPRESSION: Mild bronchitic changes without infiltrate.

## 2022-08-27 ENCOUNTER — Other Ambulatory Visit: Payer: Self-pay

## 2022-08-27 DIAGNOSIS — Z125 Encounter for screening for malignant neoplasm of prostate: Secondary | ICD-10-CM

## 2022-09-01 ENCOUNTER — Ambulatory Visit: Payer: BC Managed Care – PPO

## 2023-09-28 ENCOUNTER — Ambulatory Visit: Payer: BC Managed Care – PPO | Admitting: Emergency Medicine

## 2023-10-05 ENCOUNTER — Ambulatory Visit: Payer: BC Managed Care – PPO | Admitting: Emergency Medicine

## 2023-10-12 ENCOUNTER — Ambulatory Visit: Payer: BC Managed Care – PPO | Admitting: Emergency Medicine

## 2023-10-12 ENCOUNTER — Ambulatory Visit (INDEPENDENT_AMBULATORY_CARE_PROVIDER_SITE_OTHER): Payer: BC Managed Care – PPO

## 2023-10-12 ENCOUNTER — Encounter: Payer: Self-pay | Admitting: Emergency Medicine

## 2023-10-12 VITALS — BP 118/72 | HR 86 | Temp 98.3°F | Ht 67.0 in | Wt 145.0 lb

## 2023-10-12 DIAGNOSIS — Z13228 Encounter for screening for other metabolic disorders: Secondary | ICD-10-CM

## 2023-10-12 DIAGNOSIS — Z125 Encounter for screening for malignant neoplasm of prostate: Secondary | ICD-10-CM

## 2023-10-12 DIAGNOSIS — Z13220 Encounter for screening for lipoid disorders: Secondary | ICD-10-CM | POA: Diagnosis not present

## 2023-10-12 DIAGNOSIS — F17200 Nicotine dependence, unspecified, uncomplicated: Secondary | ICD-10-CM

## 2023-10-12 DIAGNOSIS — Z1329 Encounter for screening for other suspected endocrine disorder: Secondary | ICD-10-CM | POA: Diagnosis not present

## 2023-10-12 DIAGNOSIS — Z130 Encounter for screening for diseases of the blood and blood-forming organs and certain disorders involving the immune mechanism: Secondary | ICD-10-CM

## 2023-10-12 DIAGNOSIS — Z0001 Encounter for general adult medical examination with abnormal findings: Secondary | ICD-10-CM | POA: Diagnosis not present

## 2023-10-12 LAB — LIPID PANEL
Cholesterol: 137 mg/dL (ref 0–200)
HDL: 41.5 mg/dL (ref >39.00)
LDL Cholesterol: 68 mg/dL (ref 0–99)
NonHDL: 95.78
Total CHOL/HDL Ratio: 3
Triglycerides: 137 mg/dL (ref 0.0–149.0)
VLDL: 27.4 mg/dL (ref 0.0–40.0)

## 2023-10-12 LAB — CBC WITH DIFFERENTIAL/PLATELET
Basophils Absolute: 0.1 10*3/uL (ref 0.0–0.1)
Basophils Relative: 1.3 % (ref 0.0–3.0)
Eosinophils Absolute: 0.2 10*3/uL (ref 0.0–0.7)
Eosinophils Relative: 1.7 % (ref 0.0–5.0)
HCT: 39.2 % (ref 39.0–52.0)
Hemoglobin: 12.9 g/dL — ABNORMAL LOW (ref 13.0–17.0)
Lymphocytes Relative: 29.1 % (ref 12.0–46.0)
Lymphs Abs: 3.1 10*3/uL (ref 0.7–4.0)
MCHC: 32.9 g/dL (ref 30.0–36.0)
MCV: 83.3 fL (ref 78.0–100.0)
Monocytes Absolute: 0.9 10*3/uL (ref 0.1–1.0)
Monocytes Relative: 8.3 % (ref 3.0–12.0)
Neutro Abs: 6.4 10*3/uL (ref 1.4–7.7)
Neutrophils Relative %: 59.6 % (ref 43.0–77.0)
Platelets: 323 10*3/uL (ref 150.0–400.0)
RBC: 4.71 Mil/uL (ref 4.22–5.81)
RDW: 14.8 % (ref 11.5–15.5)
WBC: 10.7 10*3/uL — ABNORMAL HIGH (ref 4.0–10.5)

## 2023-10-12 LAB — COMPREHENSIVE METABOLIC PANEL
ALT: 16 U/L (ref 0–53)
AST: 19 U/L (ref 0–37)
Albumin: 4.2 g/dL (ref 3.5–5.2)
Alkaline Phosphatase: 78 U/L (ref 39–117)
BUN: 15 mg/dL (ref 6–23)
CO2: 29 meq/L (ref 19–32)
Calcium: 9.1 mg/dL (ref 8.4–10.5)
Chloride: 101 meq/L (ref 96–112)
Creatinine, Ser: 0.91 mg/dL (ref 0.40–1.50)
GFR: 96.35 mL/min (ref >60.00)
Glucose, Bld: 82 mg/dL (ref 70–99)
Potassium: 4.8 meq/L (ref 3.5–5.1)
Sodium: 137 meq/L (ref 135–145)
Total Bilirubin: 0.4 mg/dL (ref 0.2–1.2)
Total Protein: 6.8 g/dL (ref 6.0–8.3)

## 2023-10-12 LAB — HEMOGLOBIN A1C: Hgb A1c MFr Bld: 6.6 % — ABNORMAL HIGH (ref 4.6–6.5)

## 2023-10-12 LAB — TSH: TSH: 0.29 u[IU]/mL — ABNORMAL LOW (ref 0.35–5.50)

## 2023-10-12 LAB — PSA: PSA: 1.22 ng/mL (ref 0.10–4.00)

## 2023-10-12 NOTE — Patient Instructions (Signed)
Health Maintenance, Male Adopting a healthy lifestyle and getting preventive care are important in promoting health and wellness. Ask your health care provider about: The right schedule for you to have regular tests and exams. Things you can do on your own to prevent diseases and keep yourself healthy. What should I know about diet, weight, and exercise? Eat a healthy diet  Eat a diet that includes plenty of vegetables, fruits, low-fat dairy products, and lean protein. Do not eat a lot of foods that are high in solid fats, added sugars, or sodium. Maintain a healthy weight Body mass index (BMI) is a measurement that can be used to identify possible weight problems. It estimates body fat based on height and weight. Your health care provider can help determine your BMI and help you achieve or maintain a healthy weight. Get regular exercise Get regular exercise. This is one of the most important things you can do for your health. Most adults should: Exercise for at least 150 minutes each week. The exercise should increase your heart rate and make you sweat (moderate-intensity exercise). Do strengthening exercises at least twice a week. This is in addition to the moderate-intensity exercise. Spend less time sitting. Even light physical activity can be beneficial. Watch cholesterol and blood lipids Have your blood tested for lipids and cholesterol at 53 years of age, then have this test every 5 years. You may need to have your cholesterol levels checked more often if: Your lipid or cholesterol levels are high. You are older than 53 years of age. You are at high risk for heart disease. What should I know about cancer screening? Many types of cancers can be detected early and may often be prevented. Depending on your health history and family history, you may need to have cancer screening at various ages. This may include screening for: Colorectal cancer. Prostate cancer. Skin cancer. Lung  cancer. What should I know about heart disease, diabetes, and high blood pressure? Blood pressure and heart disease High blood pressure causes heart disease and increases the risk of stroke. This is more likely to develop in people who have high blood pressure readings or are overweight. Talk with your health care provider about your target blood pressure readings. Have your blood pressure checked: Every 3-5 years if you are 18-39 years of age. Every year if you are 40 years old or older. If you are between the ages of 65 and 75 and are a current or former smoker, ask your health care provider if you should have a one-time screening for abdominal aortic aneurysm (AAA). Diabetes Have regular diabetes screenings. This checks your fasting blood sugar level. Have the screening done: Once every three years after age 45 if you are at a normal weight and have a low risk for diabetes. More often and at a younger age if you are overweight or have a high risk for diabetes. What should I know about preventing infection? Hepatitis B If you have a higher risk for hepatitis B, you should be screened for this virus. Talk with your health care provider to find out if you are at risk for hepatitis B infection. Hepatitis C Blood testing is recommended for: Everyone born from 1945 through 1965. Anyone with known risk factors for hepatitis C. Sexually transmitted infections (STIs) You should be screened each year for STIs, including gonorrhea and chlamydia, if: You are sexually active and are younger than 53 years of age. You are older than 53 years of age and your   health care provider tells you that you are at risk for this type of infection. Your sexual activity has changed since you were last screened, and you are at increased risk for chlamydia or gonorrhea. Ask your health care provider if you are at risk. Ask your health care provider about whether you are at high risk for HIV. Your health care provider  may recommend a prescription medicine to help prevent HIV infection. If you choose to take medicine to prevent HIV, you should first get tested for HIV. You should then be tested every 3 months for as long as you are taking the medicine. Follow these instructions at home: Alcohol use Do not drink alcohol if your health care provider tells you not to drink. If you drink alcohol: Limit how much you have to 0-2 drinks a day. Know how much alcohol is in your drink. In the [place] one drink equals one 12 oz bottle of beer (355 mL), one 5 oz glass of wine (148 mL), or one 1 oz glass of hard liquor (44 mL). Lifestyle Do not use any products that contain nicotine or tobacco. These products include cigarettes, chewing tobacco, and vaping devices, such as e-cigarettes. If you need help quitting, ask your health care provider. Do not use street drugs. Do not share needles. Ask your health care provider for help if you need support or information about quitting drugs. General instructions Schedule regular health, dental, and eye exams. Stay current with your vaccines. Tell your health care provider if: You often feel depressed. You have ever been abused or do not feel safe at home. Summary Adopting a healthy lifestyle and getting preventive care are important in promoting health and wellness. Follow your health care provider's instructions about healthy diet, exercising, and getting tested or screened for diseases. Follow your health care provider's instructions on monitoring your cholesterol and blood pressure. This information is not intended to replace advice given to you by your health care provider. Make sure you discuss any questions you have with your health care provider. Document Revised: 04/08/2021 Document Reviewed: 04/08/2021 [employer] Patient Education  2024 [employer]  

## 2023-10-12 NOTE — Progress Notes (Signed)
Derrick Jenkins 52 y.o.   Chief Complaint  Patient presents with   Follow-up    Follow up. Patient states wanting to check up on his heart, lungs, and kidney    HISTORY OF PRESENT ILLNESS: This is a 52 y.o. male here for annual exam and follow-up of chronic conditions. Overall doing well.  Has no complaints or medical concerns today.  HPI   Prior to Admission medications   Medication Sig Start Date End Date Taking? Authorizing Provider  nitroGLYCERIN (NITROSTAT) 0.4 MG SL tablet Place 1 tablet (0.4 mg total) under the tongue every 5 (five) minutes as needed for chest pain (CP or SOB). 04/17/17 08/27/19  Abelino Derrick, PA-C    No Known Allergies  Patient Active Problem List   Diagnosis Date Noted   Cocaine abuse (HCC) 04/17/2017   Smoker 04/17/2017   Low TSH level 04/17/2017   Normal coronary arteries 04/17/2017   Abnormal EKG 04/16/2017    No past medical history on file.  Past Surgical History:  Procedure Laterality Date   CARDIAC CATHETERIZATION     ELBOW FRACTURE SURGERY Left 1997   S/P fall   LEFT HEART CATH AND CORONARY ANGIOGRAPHY N/A 04/16/2017   Procedure: Left Heart Cath and Coronary Angiography;  Surgeon: Yvonne Kendall, MD;  Location: [facility];  Service: Cardiovascular;  Laterality: N/A;    Social History   Socioeconomic History   Marital status: Single    Spouse name: Not on file   Number of children: Not on file   Years of education: Not on file   Highest education level: Not on file  Occupational History   Not on file  Tobacco Use   Smoking status: Every Day    Current packs/day: 0.50    Average packs/day: 0.5 packs/day for 28.0 years (14.0 ttl pk-yrs)    Types: Cigarettes   Smokeless tobacco: Never  Vaping Use   Vaping status: Never Used  Substance and Sexual Activity   Alcohol use: Yes    Alcohol/week: 12.0 standard drinks of alcohol    Types: 12 Cans of beer per week    Comment: "I drink on Friday nights only"   Drug use:  Yes    Frequency: 2.0 times per week    Types: Marijuana   Sexual activity: Not Currently    Partners: Male  Other Topics Concern   Not on file  Social History Narrative   Not on file   Social Determinants of Health   Financial Resource Strain: Not on file  Food Insecurity: Not on file  Transportation Needs: Not on file  Physical Activity: Not on file  Stress: Not on file  Social Connections: Not on file  Intimate Partner Violence: Not on file    Family History  Problem Relation Age of Onset   Lupus Mother    Kidney failure Mother    Colon cancer Neg Hx    Colon polyps Neg Hx    Esophageal cancer Neg Hx    Rectal cancer Neg Hx    Stomach cancer Neg Hx      Review of Systems  Constitutional: Negative.  Negative for chills and fever.  HENT: Negative.  Negative for congestion and sore throat.   Respiratory: Negative.  Negative for cough and shortness of breath.   Cardiovascular: Negative.  Negative for chest pain and palpitations.  Gastrointestinal:  Negative for abdominal pain, diarrhea, nausea and vomiting.  Skin: Negative.  Negative for rash.  Neurological: Negative.  Negative for  dizziness and headaches.  All other systems reviewed and are negative.   Vitals:   10/12/23 0829  BP: 118/72  Pulse: 86  Temp: 98.3 F (36.8 C)  SpO2: 97%    Physical Exam Vitals reviewed.  Constitutional:      Appearance: Normal appearance.  HENT:     Head: Normocephalic.     Mouth/Throat:     Mouth: Mucous membranes are moist.     Pharynx: Oropharynx is clear.  Eyes:     Extraocular Movements: Extraocular movements intact.     Conjunctiva/sclera: Conjunctivae normal.     Pupils: Pupils are equal, round, and reactive to light.  Cardiovascular:     Rate and Rhythm: Normal rate and regular rhythm.     Pulses: Normal pulses.     Heart sounds: Normal heart sounds.  Pulmonary:     Effort: Pulmonary effort is normal.     Breath sounds: Normal breath sounds.  Abdominal:      Palpations: Abdomen is soft.  Musculoskeletal:        General: Normal range of motion.     Cervical back: No tenderness.  Lymphadenopathy:     Cervical: No cervical adenopathy.  Skin:    General: Skin is warm and dry.  Neurological:     General: No focal deficit present.     Mental Status: He is alert and oriented to person, place, and time.  Psychiatric:        Mood and Affect: Mood normal.        Behavior: Behavior normal.      ASSESSMENT & PLAN: Problem List Items Addressed This Visit       Other   Smoker    Cardiovascular and cancer risks associated with smoking discussed Smoke cessation advice given. Chest x-ray done today.  Report reviewed      Relevant Orders   DG Chest 2 View   Other Visit Diagnoses     Encounter for general adult medical examination with abnormal findings    -  Primary   Relevant Orders   CBC with Differential   Comprehensive metabolic panel   Hemoglobin A1c   Lipid panel   PSA(Must document that pt has been informed of limitations of PSA testing.)   TSH   Screening for prostate cancer       Relevant Orders   PSA(Must document that pt has been informed of limitations of PSA testing.)   Screening for deficiency anemia       Relevant Orders   CBC with Differential   Screening for lipoid disorders       Relevant Orders   Lipid panel   Screening for endocrine, metabolic and immunity disorder       Relevant Orders   Comprehensive metabolic panel   Hemoglobin A1c   TSH      Modifiable risk factors discussed with patient. Anticipatory guidance according to age provided. The following topics were also discussed: Social Determinants of Health Smoking Diet and nutrition Benefits of exercise Cancer screening and review of most recent colonoscopy report from 2023 Vaccinations review and recommendations Cardiovascular risk assessment The 10-year ASCVD risk score (Arnett DK, et al., 2019) is: 8.6%   Values used to calculate the  score:     Age: 24 years     Sex: Male     Is Non-Hispanic African American: Yes     Diabetic: No     Tobacco smoker: Yes     Systolic Blood Pressure: 118 mmHg  Is BP treated: No     HDL Cholesterol: 40.6 mg/dL     Total Cholesterol: 140 mg/dL  Mental health including depression and anxiety Fall and accident prevention  Patient Instructions  Health Maintenance, Male Adopting a healthy lifestyle and getting preventive care are important in promoting health and wellness. Ask your health care provider about: The right schedule for you to have regular tests and exams. Things you can do on your own to prevent diseases and keep yourself healthy. What should I know about diet, weight, and exercise? Eat a healthy diet  Eat a diet that includes plenty of vegetables, fruits, low-fat dairy products, and lean protein. Do not eat a lot of foods that are high in solid fats, added sugars, or sodium. Maintain a healthy weight Body mass index (BMI) is a measurement that can be used to identify possible weight problems. It estimates body fat based on height and weight. Your health care provider can help determine your BMI and help you achieve or maintain a healthy weight. Get regular exercise Get regular exercise. This is one of the most important things you can do for your health. Most adults should: Exercise for at least 150 minutes each week. The exercise should increase your heart rate and make you sweat (moderate-intensity exercise). Do strengthening exercises at least twice a week. This is in addition to the moderate-intensity exercise. Spend less time sitting. Even light physical activity can be beneficial. Watch cholesterol and blood lipids Have your blood tested for lipids and cholesterol at 53 years of age, then have this test every 5 years. You may need to have your cholesterol levels checked more often if: Your lipid or cholesterol levels are high. You are older than 53 years of  age. You are at high risk for heart disease. What should I know about cancer screening? Many types of cancers can be detected early and may often be prevented. Depending on your health history and family history, you may need to have cancer screening at various ages. This may include screening for: Colorectal cancer. Prostate cancer. Skin cancer. Lung cancer. What should I know about heart disease, diabetes, and high blood pressure? Blood pressure and heart disease High blood pressure causes heart disease and increases the risk of stroke. This is more likely to develop in people who have high blood pressure readings or are overweight. Talk with your health care provider about your target blood pressure readings. Have your blood pressure checked: Every 3-5 years if you are 36-77 years of age. Every year if you are 58 years old or older. If you are between the ages of 32 and 29 and are a current or former smoker, ask your health care provider if you should have a one-time screening for abdominal aortic aneurysm (AAA). Diabetes Have regular diabetes screenings. This checks your fasting blood sugar level. Have the screening done: Once every three years after age 52 if you are at a normal weight and have a low risk for diabetes. More often and at a younger age if you are overweight or have a high risk for diabetes. What should I know about preventing infection? Hepatitis B If you have a higher risk for hepatitis B, you should be screened for this virus. Talk with your health care provider to find out if you are at risk for hepatitis B infection. Hepatitis C Blood testing is recommended for: Everyone born from [birth year] through 1966. Anyone with known risk factors for hepatitis C. Sexually transmitted infections (STIs)  You should be screened each year for STIs, including gonorrhea and chlamydia, if: You are sexually active and are younger than 53 years of age. You are older than 53 years of age  and your health care provider tells you that you are at risk for this type of infection. Your sexual activity has changed since you were last screened, and you are at increased risk for chlamydia or gonorrhea. Ask your health care provider if you are at risk. Ask your health care provider about whether you are at high risk for HIV. Your health care provider may recommend a prescription medicine to help prevent HIV infection. If you choose to take medicine to prevent HIV, you should first get tested for HIV. You should then be tested every 3 months for as long as you are taking the medicine. Follow these instructions at home: Alcohol use Do not drink alcohol if your health care provider tells you not to drink. If you drink alcohol: Limit how much you have to 0-2 drinks a day. Know how much alcohol is in your drink. In the [place] one drink equals one 12 oz bottle of beer (355 mL), one 5 oz glass of wine (148 mL), or one 1 oz glass of hard liquor (44 mL). Lifestyle Do not use any products that contain nicotine or tobacco. These products include cigarettes, chewing tobacco, and vaping devices, such as e-cigarettes. If you need help quitting, ask your health care provider. Do not use street drugs. Do not share needles. Ask your health care provider for help if you need support or information about quitting drugs. General instructions Schedule regular health, dental, and eye exams. Stay current with your vaccines. Tell your health care provider if: You often feel depressed. You have ever been abused or do not feel safe at home. Summary Adopting a healthy lifestyle and getting preventive care are important in promoting health and wellness. Follow your health care provider's instructions about healthy diet, exercising, and getting tested or screened for diseases. Follow your health care provider's instructions on monitoring your cholesterol and blood pressure. This information is not intended to  replace advice given to you by your health care provider. Make sure you discuss any questions you have with your health care provider. Document Revised: 04/08/2021 Document Reviewed: 04/08/2021 Elsevier Patient Education  2024 Elsevier Inc.      Edwina Barth, MD [facility]

## 2023-10-12 NOTE — Assessment & Plan Note (Addendum)
Cardiovascular and cancer risks associated with smoking discussed Smoke cessation advice given. Chest x-ray done today.  Report reviewed

## 2023-10-16 ENCOUNTER — Other Ambulatory Visit: Payer: Self-pay

## 2023-10-16 ENCOUNTER — Emergency Department (HOSPITAL_COMMUNITY)
Admission: EM | Admit: 2023-10-16 | Discharge: 2023-10-16 | Disposition: A | Discharge status: Home / Self Care | Payer: BC Managed Care – PPO | Attending: Emergency Medicine | Admitting: Emergency Medicine

## 2023-10-16 DIAGNOSIS — J069 Acute upper respiratory infection, unspecified: Secondary | ICD-10-CM | POA: Insufficient documentation

## 2023-10-16 DIAGNOSIS — M791 Myalgia, unspecified site: Secondary | ICD-10-CM | POA: Insufficient documentation

## 2023-10-16 DIAGNOSIS — Z20822 Contact with and (suspected) exposure to covid-19: Secondary | ICD-10-CM | POA: Insufficient documentation

## 2023-10-16 DIAGNOSIS — R059 Cough, unspecified: Secondary | ICD-10-CM | POA: Diagnosis present

## 2023-10-16 LAB — RESP PANEL BY RT-PCR (RSV, FLU A&B, COVID)  RVPGX2
Influenza A by PCR: NEGATIVE
Influenza B by PCR: NEGATIVE
Resp Syncytial Virus by PCR: NEGATIVE
SARS Coronavirus 2 by RT PCR: NEGATIVE

## 2023-10-16 NOTE — ED Triage Notes (Signed)
Pt. Stated, I started yesterday with chills, and achy all over. Went to nurse at my work and said I neeed a COVID test and flu shot

## 2023-10-16 NOTE — ED Provider Notes (Signed)
[facility] AT [facility] Provider Note   CSN: [id number] Arrival date & time: 10/16/23  1023     History  Chief Complaint  Patient presents with   Chills   Generalized Body Aches    Derrick Jenkins is a 52 y.o. male.  Patient complains of a cough and congestion.  Patient reports that he went to the nurse at his job who advised him to come to the emergency department.  Patient reports he does not have any shortness of breath he has had the sensation of being cold but does not think he has had any fever patient denies any difficulty breathing.  Patient request testing for flu and COVID.  The history is provided by the patient. No language interpreter was used.       Home Medications Prior to Admission medications   Medication Sig Start Date End Date Taking? Authorizing Provider  nitroGLYCERIN (NITROSTAT) 0.4 MG SL tablet Place 1 tablet (0.4 mg total) under the tongue every 5 (five) minutes as needed for chest pain (CP or SOB). 04/17/17 08/27/19  Abelino Derrick, PA-C      Allergies    Patient has no known allergies.    Review of Systems   Review of Systems  All other systems reviewed and are negative.   Physical Exam Updated Vital Signs BP 126/74 (BP Location: Right Arm)   Pulse 74   Temp 98.4 F (36.9 C)   Resp 16   SpO2 100%  Physical Exam Vitals and nursing note reviewed.  Constitutional:      Appearance: He is well-developed.  HENT:     Head: Normocephalic.  Cardiovascular:     Rate and Rhythm: Normal rate.  Pulmonary:     Effort: Pulmonary effort is normal.  Abdominal:     General: There is no distension.  Musculoskeletal:        General: Normal range of motion.  Skin:    General: Skin is warm.  Neurological:     General: No focal deficit present.     Mental Status: He is alert and oriented to person, place, and time.     ED Results / Procedures / Treatments   Labs (all labs ordered are listed, but only abnormal  results are displayed) Labs Reviewed  RESP PANEL BY RT-PCR (RSV, FLU A&B, COVID)  [id number]    EKG None  Radiology No results found.  Procedures Procedures    Medications Ordered in ED Medications - No data to display  ED Course/ Medical Decision Making/ A&P                                 Medical Decision Making Patient here for cough and congestion.  His employer sent him for here for evaluation for flu or COVID  Amount and/or Complexity of Data Reviewed Labs: ordered. Decision-making details documented in ED Course.    Details: Labs ordered reviewed and interpreted COVID and influenza are negative  Risk OTC drugs. Risk Details: Patient advised over-the-counter medications return if symptoms worsen or change           Final Clinical Impression(s) / ED Diagnoses Final diagnoses:  Upper respiratory tract infection, unspecified type    Rx / DC Orders ED Discharge Orders     None     An After Visit Summary was printed and given to the patient.     Elson Areas, [place]  10/16/23 1455    Loetta Rough, MD 10/16/23 [phone]

## 2023-10-20 ENCOUNTER — Encounter: Payer: Self-pay | Admitting: Emergency Medicine

## 2023-10-20 ENCOUNTER — Ambulatory Visit: Payer: BC Managed Care – PPO | Admitting: Emergency Medicine

## 2023-10-20 VITALS — BP 118/68 | HR 89 | Temp 98.6°F | Ht 67.0 in | Wt 142.0 lb

## 2023-10-20 DIAGNOSIS — R091 Pleurisy: Secondary | ICD-10-CM | POA: Diagnosis not present

## 2023-10-20 MED ORDER — AZITHROMYCIN 250 MG PO TABS: ORAL_TABLET | ORAL | 0 refills | Status: AC

## 2023-10-20 MED ORDER — TRAMADOL HCL 50 MG PO TABS: 50.0000 mg | ORAL_TABLET | Freq: Three times a day (TID) | ORAL | 0 refills | Status: AC | PRN

## 2023-10-20 NOTE — Progress Notes (Signed)
Derrick Jenkins 52 y.o.   Chief Complaint  Patient presents with   Chest Pain    states chest pain started last night 7:30 pm. Hurts right under his heart that moves around to his back. Patient states this morning he had a sharpe pain on his left side at work.  Also c/o of bad cough that's been happening since Friday, he went to the ED requested by his job to get tested flu was negative; covid negative. He states wanting something for the cough. Pt also wanted to mention this morning he had trouble swallowing it was nothing in particular he was swallowing but he states "in general      HISTORY OF PRESENT ILLNESS: This is a 52 y.o. male complaining of left-sided sharp pleuritic chest pain that started last night at about 730 Was at home.  At rest.  No radiation.  No associated symptoms. Had cough last week.  COVID-negative. No other complaints or medical concerns today.  Chest Pain  Pertinent negatives include no abdominal pain, cough, dizziness, fever, headaches, nausea, palpitations, shortness of breath or vomiting.     Prior to Admission medications   Medication Sig Start Date End Date Taking? Authorizing Provider  nitroGLYCERIN (NITROSTAT) 0.4 MG SL tablet Place 1 tablet (0.4 mg total) under the tongue every 5 (five) minutes as needed for chest pain (CP or SOB). 04/17/17 08/27/19  Abelino Derrick, PA-C    No Known Allergies  Patient Active Problem List   Diagnosis Date Noted   Cocaine abuse (HCC) 04/17/2017   Smoker 04/17/2017   Low TSH level 04/17/2017   Normal coronary arteries 04/17/2017   Abnormal EKG 04/16/2017    No past medical history on file.  Past Surgical History:  Procedure Laterality Date   CARDIAC CATHETERIZATION     ELBOW FRACTURE SURGERY Left 1997   S/P fall   LEFT HEART CATH AND CORONARY ANGIOGRAPHY N/A 04/16/2017   Procedure: Left Heart Cath and Coronary Angiography;  Surgeon: Yvonne Kendall, MD;  Location: [facility];  Service: Cardiovascular;   Laterality: N/A;    Social History   Socioeconomic History   Marital status: Single    Spouse name: Not on file   Number of children: Not on file   Years of education: Not on file   Highest education level: Not on file  Occupational History   Not on file  Tobacco Use   Smoking status: Every Day    Current packs/day: 0.50    Average packs/day: 0.5 packs/day for 28.0 years (14.0 ttl pk-yrs)    Types: Cigarettes   Smokeless tobacco: Never  Vaping Use   Vaping status: Never Used  Substance and Sexual Activity   Alcohol use: Yes    Alcohol/week: 12.0 standard drinks of alcohol    Types: 12 Cans of beer per week    Comment: "I drink on Friday nights only"   Drug use: Yes    Frequency: 2.0 times per week    Types: Marijuana   Sexual activity: Not Currently    Partners: Male  Other Topics Concern   Not on file  Social History Narrative   Not on file   Social Determinants of Health   Financial Resource Strain: Not on file  Food Insecurity: Not on file  Transportation Needs: Not on file  Physical Activity: Not on file  Stress: Not on file  Social Connections: Not on file  Intimate Partner Violence: Not on file    Family History  Problem  Relation Age of Onset   Lupus Mother    Kidney failure Mother    Colon cancer Neg Hx    Colon polyps Neg Hx    Esophageal cancer Neg Hx    Rectal cancer Neg Hx    Stomach cancer Neg Hx      Review of Systems  Constitutional: Negative.  Negative for chills and fever.  HENT: Negative.  Negative for congestion and sore throat.   Respiratory: Negative.  Negative for cough and shortness of breath.   Cardiovascular:  Positive for chest pain. Negative for palpitations.  Gastrointestinal:  Negative for abdominal pain, diarrhea, nausea and vomiting.  Skin: Negative.  Negative for rash.  Neurological: Negative.  Negative for dizziness and headaches.  All other systems reviewed and are negative.   Vitals:   10/20/23 1545  BP: 118/68   Pulse: 89  Temp: 98.6 F (37 C)  SpO2: 96%    Physical Exam Vitals reviewed.  Constitutional:      Appearance: Normal appearance.  HENT:     Head: Normocephalic.  Eyes:     Extraocular Movements: Extraocular movements intact.     Pupils: Pupils are equal, round, and reactive to light.  Cardiovascular:     Rate and Rhythm: Normal rate and regular rhythm.     Pulses: Normal pulses.     Heart sounds: Normal heart sounds.  Pulmonary:     Effort: Pulmonary effort is normal.     Breath sounds: Normal breath sounds.  Abdominal:     Palpations: Abdomen is soft.     Tenderness: There is no abdominal tenderness.  Skin:    General: Skin is warm and dry.     Capillary Refill: Capillary refill takes less than 2 seconds.  Neurological:     General: No focal deficit present.     Mental Status: He is alert and oriented to person, place, and time.  Psychiatric:        Mood and Affect: Mood normal.        Behavior: Behavior normal.     DG Chest 2 View  Result Date: 10/12/2023 CLINICAL DATA:  Cough for 3 days. EXAM: CHEST - 2 VIEW COMPARISON:  01/24/2021 FINDINGS: The heart size and mediastinal contours are within normal limits. Both lungs are clear. The visualized skeletal structures are unremarkable. IMPRESSION: No active cardiopulmonary disease. Electronically Signed   By: Danae Orleans M.D.   On: 10/12/2023 10:23     EKG: Normal sinus rhythm with ventricular rate of 80/min.  No acute ischemic changes.  Possible LVH.  ASSESSMENT & PLAN: A total of 45 minutes was spent with the patient and counseling/coordination of care regarding preparing for this visit, review of most recent office visit notes, review of most recent blood work results, review of most recent chest x-ray report, review of chronic medical conditions under management, review of all medications, diagnosis of pleurisy and need for antibiotics, pain management, prognosis, ED precautions, documentation and need for  follow-up  Problem List Items Addressed This Visit       Respiratory   Pleurisy - Primary    Clinically stable.  No red flag signs or symptoms. Cough of 2 weeks duration.  Had flulike symptoms last week Viral respiratory infection creating pleurisy Normal EKG.  Normal chest x-ray done last week Afebrile.  No findings of pneumonia Recommend azithromycin for 5 days Pain management discussed Recommend Tylenol and or Advil for mild to moderate pain and tramadol for moderate to severe pain ED  precautions given Advised to contact the office if no better or worse during the next several days.      Relevant Medications   azithromycin (ZITHROMAX) 250 MG tablet   traMADol (ULTRAM) 50 MG tablet   Patient Instructions  Pleurisy  Pleurisy is when the lining of your lungs (pleura) swells and gets irritated. This can cause pain in your chest, back, or shoulder. You may also have trouble breathing. What are the causes? A lung infection. A blood clot that goes into your lungs. Injury to the chest. Heart or chest surgery. Certain medicines or treatment for cancer. Certain diseases, such as lung cancer. In some cases, the cause is not known. What are the signs or symptoms? Chest pain that may: Be on one side of your body. Be sharp and stabbing. Get worse when you cough or breathe deep. Trouble breathing. Noisy breathing. Cough. Fever. Coughing up blood. Your symptoms may get worse when you lie down or lie on your side. How is this treated? You may need: Medicines to treat swelling, pain, or a cough. Antibiotics. Blood thinners. You may need these if you have a blood clot. To have the fluid or air taken out of your lungs with a tube. Follow these instructions at home: Medicines Take over-the-counter and prescription medicines only as told by your doctor. If you were prescribed antibiotics, take them as told by your doctor. Do not stop using them even if you start to feel  better. If you were prescribed medicines to remove fluid from your lungs (diuretics), take them as told by your doctor. If you are taking blood thinners: Talk with your doctor before taking any medicines that have aspirin or NSAIDs, such as ibuprofen. Take medicines exactly as told. Take them at the same time each day. Do not do things that could hurt or bruise you. Be careful to avoid falls. Wear an alert bracelet or carry a card that says you take blood thinners. Ask your doctor if you should avoid driving or using machines while you are taking your medicine. Activity Rest as told by your doctor. Return to your normal activities when your doctor says that it is safe. General instructions Watch for any changes in how you feel. Take deep breaths often, even if it hurts. This can help prevent lung problems. You may be given a machine called an incentive spirometer. This can help you breathe better. Do not smoke or use any products that contain nicotine or tobacco. If you need help quitting, ask your doctor. Contact a doctor if: You have pain that gets worse or happens more often. Your pain does not get better with medicine. You have a fever or chills. Your cough or trouble breathing does not get better. You cough up mucus or thick spit (phlegm) that looks like pus. You cough up blood. Get help right away if: You have noisy breathing or more trouble breathing. You have pain that spreads into your neck, arms, or jaw. You feel faint or dizzy. These symptoms may be an emergency. Get help right away. Call 911. Do not wait to see if the symptoms will go away. Do not drive yourself to the hospital. This information is not intended to replace advice given to you by your health care provider. Make sure you discuss any questions you have with your health care provider. Document Revised: 06/27/2022 Document Reviewed: 06/27/2022 Elsevier Patient Education  2024 [employer]     Edwina Barth, MD [facility] [employer]  [facility]

## 2023-10-20 NOTE — Assessment & Plan Note (Signed)
Clinically stable.  No red flag signs or symptoms. Cough of 2 weeks duration.  Had flulike symptoms last week Viral respiratory infection creating pleurisy Normal EKG.  Normal chest x-ray done last week Afebrile.  No findings of pneumonia Recommend azithromycin for 5 days Pain management discussed Recommend Tylenol and or Advil for mild to moderate pain and tramadol for moderate to severe pain ED precautions given Advised to contact the office if no better or worse during the next several days.

## 2023-10-20 NOTE — Patient Instructions (Signed)
Pleurisy  Pleurisy is when the lining of your lungs (pleura) swells and gets irritated. This can cause pain in your chest, back, or shoulder. You may also have trouble breathing. What are the causes? A lung infection. A blood clot that goes into your lungs. Injury to the chest. Heart or chest surgery. Certain medicines or treatment for cancer. Certain diseases, such as lung cancer. In some cases, the cause is not known. What are the signs or symptoms? Chest pain that may: Be on one side of your body. Be sharp and stabbing. Get worse when you cough or breathe deep. Trouble breathing. Noisy breathing. Cough. Fever. Coughing up blood. Your symptoms may get worse when you lie down or lie on your side. How is this treated? You may need: Medicines to treat swelling, pain, or a cough. Antibiotics. Blood thinners. You may need these if you have a blood clot. To have the fluid or air taken out of your lungs with a tube. Follow these instructions at home: Medicines Take over-the-counter and prescription medicines only as told by your doctor. If you were prescribed antibiotics, take them as told by your doctor. Do not stop using them even if you start to feel better. If you were prescribed medicines to remove fluid from your lungs (diuretics), take them as told by your doctor. If you are taking blood thinners: Talk with your doctor before taking any medicines that have aspirin or NSAIDs, such as ibuprofen. Take medicines exactly as told. Take them at the same time each day. Do not do things that could hurt or bruise you. Be careful to avoid falls. Wear an alert bracelet or carry a card that says you take blood thinners. Ask your doctor if you should avoid driving or using machines while you are taking your medicine. Activity Rest as told by your doctor. Return to your normal activities when your doctor says that it is safe. General instructions Watch for any changes in how you  feel. Take deep breaths often, even if it hurts. This can help prevent lung problems. You may be given a machine called an incentive spirometer. This can help you breathe better. Do not smoke or use any products that contain nicotine or tobacco. If you need help quitting, ask your doctor. Contact a doctor if: You have pain that gets worse or happens more often. Your pain does not get better with medicine. You have a fever or chills. Your cough or trouble breathing does not get better. You cough up mucus or thick spit (phlegm) that looks like pus. You cough up blood. Get help right away if: You have noisy breathing or more trouble breathing. You have pain that spreads into your neck, arms, or jaw. You feel faint or dizzy. These symptoms may be an emergency. Get help right away. Call 911. Do not wait to see if the symptoms will go away. Do not drive yourself to the hospital. This information is not intended to replace advice given to you by your health care provider. Make sure you discuss any questions you have with your health care provider. Document Revised: 06/27/2022 Document Reviewed: 06/27/2022 Elsevier Patient Education  2024 [employer].

## 2023-10-22 NOTE — Addendum Note (Signed)
Addended by: Aundra Millet on: 10/22/2023 04:54 PM   Modules accepted: Orders
